# Patient Record
Sex: Female | Born: 1960 | Race: Black or African American | Hispanic: No | Marital: Single | Smoking: Current every day smoker
Health system: Southern US, Community
[De-identification: ages and names within clinical notes are randomized; demographics above are authoritative.]

## PROBLEM LIST (undated history)

## (undated) DIAGNOSIS — D869 Sarcoidosis, unspecified: Secondary | ICD-10-CM

## (undated) DIAGNOSIS — J45909 Unspecified asthma, uncomplicated: Secondary | ICD-10-CM

## (undated) DIAGNOSIS — I10 Essential (primary) hypertension: Secondary | ICD-10-CM

## (undated) DIAGNOSIS — J449 Chronic obstructive pulmonary disease, unspecified: Secondary | ICD-10-CM

## (undated) HISTORY — PX: THROAT SURGERY: SHX803

## (undated) HISTORY — PX: FOOT FUSION: SHX956

## (undated) HISTORY — PX: HERNIA REPAIR: SHX51

## (undated) HISTORY — PX: TONSILLECTOMY: SUR1361

---

## 1997-08-20 ENCOUNTER — Emergency Department (HOSPITAL_COMMUNITY): Admission: EM | Admit: 1997-08-20 | Discharge: 1997-08-20 | Payer: Self-pay | Admitting: Emergency Medicine

## 2013-03-26 ENCOUNTER — Emergency Department (HOSPITAL_BASED_OUTPATIENT_CLINIC_OR_DEPARTMENT_OTHER): Payer: Medicaid Other

## 2013-03-26 ENCOUNTER — Encounter (HOSPITAL_BASED_OUTPATIENT_CLINIC_OR_DEPARTMENT_OTHER): Payer: Self-pay | Admitting: Emergency Medicine

## 2013-03-26 ENCOUNTER — Emergency Department (HOSPITAL_BASED_OUTPATIENT_CLINIC_OR_DEPARTMENT_OTHER)
Admission: EM | Admit: 2013-03-26 | Discharge: 2013-03-27 | Disposition: A | Discharge status: Home / Self Care | Payer: Medicaid Other | Attending: Emergency Medicine | Admitting: Emergency Medicine

## 2013-03-26 DIAGNOSIS — Z880 Allergy status to penicillin: Secondary | ICD-10-CM | POA: Insufficient documentation

## 2013-03-26 DIAGNOSIS — Z79899 Other long term (current) drug therapy: Secondary | ICD-10-CM | POA: Insufficient documentation

## 2013-03-26 DIAGNOSIS — J441 Chronic obstructive pulmonary disease with (acute) exacerbation: Secondary | ICD-10-CM | POA: Insufficient documentation

## 2013-03-26 DIAGNOSIS — I10 Essential (primary) hypertension: Secondary | ICD-10-CM | POA: Insufficient documentation

## 2013-03-26 DIAGNOSIS — M5430 Sciatica, unspecified side: Secondary | ICD-10-CM | POA: Insufficient documentation

## 2013-03-26 DIAGNOSIS — M5441 Lumbago with sciatica, right side: Secondary | ICD-10-CM

## 2013-03-26 DIAGNOSIS — Z8619 Personal history of other infectious and parasitic diseases: Secondary | ICD-10-CM | POA: Insufficient documentation

## 2013-03-26 DIAGNOSIS — F17200 Nicotine dependence, unspecified, uncomplicated: Secondary | ICD-10-CM | POA: Insufficient documentation

## 2013-03-26 DIAGNOSIS — IMO0002 Reserved for concepts with insufficient information to code with codable children: Secondary | ICD-10-CM | POA: Insufficient documentation

## 2013-03-26 HISTORY — DX: Essential (primary) hypertension: I10

## 2013-03-26 HISTORY — DX: Chronic obstructive pulmonary disease, unspecified: J44.9

## 2013-03-26 HISTORY — DX: Sarcoidosis, unspecified: D86.9
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
## 2013-03-26 NOTE — ED Provider Notes (Signed)
CSN: [id number]     Arrival date & time 03/26/13  2327 History   First MD Initiated Contact with Patient 03/26/13 2345     Chief Complaint  Patient presents with  . Back Pain     (Consider location/radiation/quality/duration/timing/severity/associated sxs/prior Treatment) Patient is a 53 y.o. female presenting with back pain. The history is provided by the patient.  Back Pain Location:  Lumbar spine Quality:  Stabbing Radiates to:  R thigh Pain severity:  Severe Pain is:  Same all the time Onset quality:  Gradual Duration:  3 days Timing:  Constant Progression:  Worsening Chronicity:  New Context: not recent injury   Relieved by:  Nothing Worsened by:  Ambulation and movement Ineffective treatments:  Ibuprofen Associated symptoms: leg pain   Associated symptoms: no abdominal pain, no bladder incontinence, no bowel incontinence, no chest pain, no dysuria and no fever    Dawn Moses is a 53 y.o. female who presents to the ED with low back pain that started 3 days ago and has gotten progressively worse. She took ibuprofen without relief. Today the pain is much worsen and radiates down the right leg. She went to [facility] and they told her there were 30 patient's ahead of her and [facility]  Patient recently evaluated by her pulmonologist and given cortisone injection and antibiotic. She will also be starting a Z-Pack tomorrow.   Past Medical History  Diagnosis Date  . COPD (chronic obstructive pulmonary disease)   . Sarcoidosis   . Hypertension    Past Surgical History  Procedure Laterality Date  . Foot fusion    . Throat surgery    . Hernia repair    . Tonsillectomy     No family history on file. History  Substance Use Topics  . Smoking status: Current Every Day Smoker -- 0.50 packs/day    Types: Cigarettes  . Smokeless tobacco: Not on file  . Alcohol Use: No   OB History   Grav Para Term Preterm Abortions TAB SAB Ect Mult Living                 Review of Systems  Constitutional: Negative for fever and chills.  HENT: Negative.   Eyes: Negative for visual disturbance.  Respiratory: Positive for cough. Negative for shortness of breath.   Cardiovascular: Negative for chest pain.  Gastrointestinal: Negative for nausea, vomiting, abdominal pain and bowel incontinence.  Genitourinary: Negative for bladder incontinence and dysuria.  Musculoskeletal: Positive for back pain.  Skin: Negative for rash.  Psychiatric/Behavioral: The patient is not nervous/anxious.       Allergies  Chocolate and Penicillins  Home Medications   Current Outpatient Rx  Name  Route  Sig  Dispense  Refill  . albuterol (PROVENTIL HFA;VENTOLIN HFA) 108 (90 BASE) MCG/ACT inhaler   Inhalation   Inhale into the lungs every 6 (six) hours as needed for wheezing or shortness of breath.         . Fluticasone-Salmeterol (ADVAIR HFA IN)   Inhalation   Inhale into the lungs.         . hydrochlorothiazide (HYDRODIURIL) 12.5 MG tablet   Oral   Take 12.5 mg by mouth daily.         Marland Kitchen LISINOPRIL PO   Oral   Take by mouth.          BP 173/98  Pulse 78  Temp(Src) 98.3 F (36.8 C) (Oral)  Resp 18  Ht 5\' 6"  (1.676 m)  Wt 154 lb (69.854 kg)  BMI 24.87 kg/m2  SpO2 98% Physical Exam  Nursing note and vitals reviewed. Constitutional: She is oriented to person, place, and time. She appears well-developed and well-nourished.  HENT:  Head: Normocephalic and atraumatic.  Eyes: EOM are normal.  Neck: Neck supple.  Cardiovascular: Normal rate.   Pulmonary/Chest: Effort normal. She has wheezes.  Abdominal: Soft. There is no tenderness.  Musculoskeletal:       Lumbar back: She exhibits decreased range of motion, tenderness and spasm. She exhibits no deformity, no laceration and normal pulse.       Back:  Neurological: She is alert and oriented to person, place, and time. She has normal strength and normal reflexes. No cranial nerve deficit or sensory  deficit. Gait normal.  Radial and pedal pulses strong, adequate circulation, good touch sensation.   Skin: Skin is warm and dry.  Psychiatric: She has a normal mood and affect. Her behavior is normal.    ED Course  Procedures  Results for orders placed during the hospital encounter of 03/26/13 (from the past 24 hour(s))  URINALYSIS, ROUTINE W REFLEX MICROSCOPIC     Status: Abnormal   Collection Time    03/27/13 12:17 AM      Result Value Ref Range   Color, Urine YELLOW  YELLOW   APPearance CLEAR  CLEAR   Specific Gravity, Urine 1.016  1.005 - 1.030   pH 6.5  5.0 - 8.0   Glucose, UA NEGATIVE  NEGATIVE mg/dL   Hgb urine dipstick NEGATIVE  NEGATIVE   Bilirubin Urine NEGATIVE  NEGATIVE   Ketones, ur NEGATIVE  NEGATIVE mg/dL   Protein, ur NEGATIVE  NEGATIVE mg/dL   Urobilinogen, UA 1.0  0.0 - 1.0 mg/dL   Nitrite NEGATIVE  NEGATIVE   Leukocytes, UA SMALL (*) NEGATIVE  URINE MICROSCOPIC-ADD ON     Status: Abnormal   Collection Time    03/27/13 12:17 AM      Result Value Ref Range   Squamous Epithelial / LPF FEW (*) RARE   WBC, UA 3-6  <3 WBC/hpf   Bacteria, UA FEW (*) RARE    Dg Lumbar Spine Complete  03/27/2013   CLINICAL DATA:  Low back pain radiating to right leg.  EXAM: LUMBAR SPINE - COMPLETE 4+ VIEW  COMPARISON:  None available for comparison at time of study interpretation.  FINDINGS: Five non rib-bearing lumbar type vertebral bodies are intact and aligned with maintenance of lumbar lordosis. However, there is slight L3 superior endplate focal irregularity. Severe lower lumbar facet arthropathy. No destructive bony lesions. No pars interarticularis defects.  Sacroiliac joints are symmetric. Phleboliths in the pelvis. Prevertebral and paraspinal soft tissue planes are nonsuspicious.  IMPRESSION: Slight L3 superior endplate irregularity could reflect Schmorl's node without fracture deformity or malalignment. Severe lower lumbar facet arthropathy.   Electronically Signed   By: Awilda Metro   On: 03/27/2013 00:44    MDM  53 y.o. female with low back pain that started 3 days ago and has gotten worse with the pain radiating down the right leg. I have reviewed this patient's vital signs, nurses notes, appropriate labs and imaging.  I have discussed findings and plan of care with the patient and she voices understanding. Stable for discharge with normal neuro exam.    Medication List    TAKE these medications       methocarbamol 500 MG tablet  Commonly known as:  ROBAXIN  Take 1 tablet (500 mg total) by  mouth 2 (two) times daily.      ASK your doctor about these medications       ADVAIR HFA IN  Inhale into the lungs.     albuterol 108 (90 BASE) MCG/ACT inhaler  Commonly known as:  PROVENTIL HFA;VENTOLIN HFA  Inhale into the lungs every 6 (six) hours as needed for wheezing or shortness of breath.     hydrochlorothiazide 12.5 MG tablet  Commonly known as:  HYDRODIURIL  Take 12.5 mg by mouth daily.     LISINOPRIL PO  Take by mouth.          [facility] Orlene OchM Rawn Quiroa, NP 03/27/13 [address]  Balin Vandegrift Orlene OchM Vernecia Umble, NP 03/27/13 [phone]

## 2013-03-27 LAB — URINALYSIS, ROUTINE W REFLEX MICROSCOPIC
Bilirubin Urine: NEGATIVE
Glucose, UA: NEGATIVE mg/dL
Hgb urine dipstick: NEGATIVE
KETONES UR: NEGATIVE mg/dL
Nitrite: NEGATIVE
PROTEIN: NEGATIVE mg/dL
Specific Gravity, Urine: 1.016 (ref 1.005–1.030)
Urobilinogen, UA: 1 mg/dL (ref 0.0–1.0)
pH: 6.5 (ref 5.0–8.0)

## 2013-03-27 LAB — URINE MICROSCOPIC-ADD ON

## 2013-03-27 MED ORDER — METHOCARBAMOL 500 MG PO TABS: 500.0000 mg | ORAL_TABLET | Freq: Two times a day (BID) | ORAL | Status: DC

## 2013-03-27 MED ORDER — CYCLOBENZAPRINE HCL 10 MG PO TABS
5.0000 mg | ORAL_TABLET | Freq: Once | ORAL | Status: AC
  Administered 2013-03-27: 5 mg via ORAL
  Filled 2013-03-27: qty 1

## 2013-03-27 MED ORDER — OXYCODONE-ACETAMINOPHEN 5-325 MG PO TABS
1.0000 | ORAL_TABLET | Freq: Once | ORAL | Status: AC
  Administered 2013-03-27: 1 via ORAL
  Filled 2013-03-27: qty 1

## 2013-03-27 NOTE — ED Provider Notes (Signed)
Medical screening examination/treatment/procedure(s) were performed by non-physician practitioner and as supervising physician I was immediately available for consultation/collaboration.   EKG Interpretation None       Lemmie Vanlanen K Rindi Beechy-Rasch, MD 03/27/13 [phone]

## 2013-03-27 NOTE — Discharge Instructions (Signed)
Call your doctor tomorrow to schedule a follow up appointment. Return here as needed. You may need to be scheduled for further testing for the pain that radiates down your leg.

## 2013-03-27 NOTE — ED Notes (Signed)
Pt c/o lower back pain radiating to rt hip and leg onseet Saturday after cleaning,  Denies other inj

## 2013-08-04 ENCOUNTER — Emergency Department (HOSPITAL_BASED_OUTPATIENT_CLINIC_OR_DEPARTMENT_OTHER)
Admission: EM | Admit: 2013-08-04 | Discharge: 2013-08-04 | Disposition: A | Discharge status: Other Acute Inpatient Hospital | Payer: Self-pay | Attending: Emergency Medicine | Admitting: Emergency Medicine

## 2013-08-04 ENCOUNTER — Encounter (HOSPITAL_BASED_OUTPATIENT_CLINIC_OR_DEPARTMENT_OTHER): Payer: Self-pay | Admitting: Emergency Medicine

## 2013-08-04 ENCOUNTER — Emergency Department (HOSPITAL_BASED_OUTPATIENT_CLINIC_OR_DEPARTMENT_OTHER): Payer: Medicaid Other

## 2013-08-04 DIAGNOSIS — Z880 Allergy status to penicillin: Secondary | ICD-10-CM | POA: Insufficient documentation

## 2013-08-04 DIAGNOSIS — IMO0002 Reserved for concepts with insufficient information to code with codable children: Secondary | ICD-10-CM | POA: Insufficient documentation

## 2013-08-04 DIAGNOSIS — J441 Chronic obstructive pulmonary disease with (acute) exacerbation: Secondary | ICD-10-CM | POA: Insufficient documentation

## 2013-08-04 DIAGNOSIS — Z8619 Personal history of other infectious and parasitic diseases: Secondary | ICD-10-CM | POA: Insufficient documentation

## 2013-08-04 DIAGNOSIS — I10 Essential (primary) hypertension: Secondary | ICD-10-CM | POA: Insufficient documentation

## 2013-08-04 DIAGNOSIS — F17200 Nicotine dependence, unspecified, uncomplicated: Secondary | ICD-10-CM | POA: Insufficient documentation

## 2013-08-04 DIAGNOSIS — J45901 Unspecified asthma with (acute) exacerbation: Principal | ICD-10-CM

## 2013-08-04 DIAGNOSIS — Z79899 Other long term (current) drug therapy: Secondary | ICD-10-CM | POA: Insufficient documentation

## 2013-08-04 HISTORY — DX: Unspecified asthma, uncomplicated: J45.909

## 2013-08-04 LAB — I-STAT VENOUS BLOOD GAS, ED
Bicarbonate: 26.2 mEq/L — ABNORMAL HIGH (ref 20.0–24.0)
O2 SAT: 76 %
PO2 VEN: 43 mmHg (ref 30.0–45.0)
Patient temperature: 98.6
TCO2: 28 mmol/L (ref 0–100)
pCO2, Ven: 47 mmHg (ref 45.0–50.0)
pH, Ven: 7.354 — ABNORMAL HIGH (ref 7.250–7.300)

## 2013-08-04 LAB — BASIC METABOLIC PANEL
ANION GAP: 12 (ref 5–15)
BUN: 16 mg/dL (ref 6–23)
CALCIUM: 9.3 mg/dL (ref 8.4–10.5)
CO2: 26 mEq/L (ref 19–32)
CREATININE: 0.8 mg/dL (ref 0.50–1.10)
Chloride: 105 mEq/L (ref 96–112)
GFR calc non Af Amer: 84 mL/min — ABNORMAL LOW (ref >90)
Glucose, Bld: 102 mg/dL — ABNORMAL HIGH (ref 70–99)
Potassium: 4 mEq/L (ref 3.7–5.3)
Sodium: 143 mEq/L (ref 137–147)

## 2013-08-04 LAB — CBC WITH DIFFERENTIAL/PLATELET
BASOS ABS: 0 10*3/uL (ref 0.0–0.1)
BASOS PCT: 0 % (ref 0–1)
EOS PCT: 1 % (ref 0–5)
Eosinophils Absolute: 0.1 10*3/uL (ref 0.0–0.7)
HEMATOCRIT: 38 % (ref 36.0–46.0)
Hemoglobin: 12.9 g/dL (ref 12.0–15.0)
Lymphocytes Relative: 34 % (ref 12–46)
Lymphs Abs: 3.7 10*3/uL (ref 0.7–4.0)
MCH: 31.5 pg (ref 26.0–34.0)
MCHC: 33.9 g/dL (ref 30.0–36.0)
MCV: 92.7 fL (ref 78.0–100.0)
Monocytes Absolute: 0.9 10*3/uL (ref 0.1–1.0)
Monocytes Relative: 8 % (ref 3–12)
Neutro Abs: 6.2 10*3/uL (ref 1.7–7.7)
Neutrophils Relative %: 57 % (ref 43–77)
Platelets: 259 10*3/uL (ref 150–400)
RBC: 4.1 MIL/uL (ref 3.87–5.11)
RDW: 12.1 % (ref 11.5–15.5)
WBC: 11 10*3/uL — ABNORMAL HIGH (ref 4.0–10.5)

## 2013-08-04 LAB — TROPONIN I: Troponin I: <0.3 ng/mL (ref <0.30)

## 2013-08-04 MED ORDER — METHYLPREDNISOLONE SODIUM SUCC 125 MG IJ SOLR
125.0000 mg | Freq: Once | INTRAMUSCULAR | Status: AC
  Administered 2013-08-04: 125 mg via INTRAVENOUS
  Filled 2013-08-04: qty 2

## 2013-08-04 MED ORDER — LEVOFLOXACIN IN D5W 500 MG/100ML IV SOLN
500.0000 mg | Freq: Once | INTRAVENOUS | Status: AC
  Administered 2013-08-04: 500 mg via INTRAVENOUS
  Filled 2013-08-04: qty 100

## 2013-08-04 MED ORDER — ALBUTEROL SULFATE (2.5 MG/3ML) 0.083% IN NEBU: 2.5000 mg | INHALATION_SOLUTION | Freq: Once | RESPIRATORY_TRACT | Status: DC

## 2013-08-04 MED ORDER — MORPHINE SULFATE 4 MG/ML IJ SOLN
4.0000 mg | Freq: Once | INTRAMUSCULAR | Status: AC
  Administered 2013-08-04: 4 mg via INTRAVENOUS
  Filled 2013-08-04: qty 1

## 2013-08-04 MED ORDER — ALBUTEROL SULFATE (2.5 MG/3ML) 0.083% IN NEBU: 5.0000 mg | INHALATION_SOLUTION | RESPIRATORY_TRACT | Status: DC

## 2013-08-04 MED ORDER — ALBUTEROL SULFATE (2.5 MG/3ML) 0.083% IN NEBU
2.5000 mg | INHALATION_SOLUTION | Freq: Once | RESPIRATORY_TRACT | Status: AC
  Administered 2013-08-04: 2.5 mg via RESPIRATORY_TRACT
  Filled 2013-08-04: qty 3

## 2013-08-04 MED ORDER — ALBUTEROL SULFATE (2.5 MG/3ML) 0.083% IN NEBU
10.0000 mg | INHALATION_SOLUTION | Freq: Once | RESPIRATORY_TRACT | Status: AC
  Administered 2013-08-04: 10 mg via RESPIRATORY_TRACT
  Filled 2013-08-04: qty 12

## 2013-08-04 MED ORDER — IPRATROPIUM-ALBUTEROL 0.5-2.5 (3) MG/3ML IN SOLN
3.0000 mL | Freq: Once | RESPIRATORY_TRACT | Status: AC
  Administered 2013-08-04: 3 mL via RESPIRATORY_TRACT
  Filled 2013-08-04: qty 3

## 2013-08-04 NOTE — ED Provider Notes (Signed)
CSN: [id number]     Arrival date & time 08/04/13  0427 History   First MD Initiated Contact with Patient 08/04/13 [phone]     Chief Complaint  Patient presents with  . Respiratory Distress     (Consider location/radiation/quality/duration/timing/severity/associated sxs/prior Treatment) Patient is a 53 y.o. female presenting with shortness of breath. The history is provided by a relative and the patient. The history is limited by the condition of the patient.  Shortness of Breath Severity:  Severe Onset quality:  Gradual Duration:  2 days Timing:  Constant Progression:  Worsening Chronicity:  Recurrent Context: not fumes   Relieved by:  Nothing Worsened by:  Nothing tried Ineffective treatments:  None tried Associated symptoms: cough, sputum production and wheezing   Associated symptoms: no fever   Risk factors: tobacco use   Risk factors: no recent surgery     Past Medical History  Diagnosis Date  . COPD (chronic obstructive pulmonary disease)   . Sarcoidosis   . Hypertension   . Asthma    Past Surgical History  Procedure Laterality Date  . Foot fusion    . Throat surgery    . Hernia repair    . Tonsillectomy     History reviewed. No pertinent family history. History  Substance Use Topics  . Smoking status: Current Every Day Smoker -- 0.50 packs/day    Types: Cigarettes  . Smokeless tobacco: Not on file  . Alcohol Use: No   OB History   Grav Para Term Preterm Abortions TAB SAB Ect Mult Living                 Review of Systems  Constitutional: Negative for fever.  Respiratory: Positive for cough, sputum production, shortness of breath and wheezing.   All other systems reviewed and are negative.     Allergies  Chocolate and Penicillins  Home Medications   Prior to Admission medications   Medication Sig Start Date End Date Taking? Authorizing Provider  albuterol (PROVENTIL HFA;VENTOLIN HFA) 108 (90 BASE) MCG/ACT inhaler Inhale into the lungs every 6  (six) hours as needed for wheezing or shortness of breath.   Yes Historical Provider, MD  fluticasone (FLONASE) 50 MCG/ACT nasal spray Place 1 spray into both nostrils daily.   Yes Historical Provider, MD  Fluticasone-Salmeterol (ADVAIR HFA IN) Inhale into the lungs.   Yes Historical Provider, MD  hydrochlorothiazide (HYDRODIURIL) 12.5 MG tablet Take 12.5 mg by mouth daily.   Yes Historical Provider, MD  LISINOPRIL PO Take by mouth.   Yes Historical Provider, MD  methocarbamol (ROBAXIN) 500 MG tablet Take 1 tablet (500 mg total) by mouth 2 (two) times daily. 03/27/13   Hope Orlene Och, NP   BP 141/93  Temp(Src) 98.1 F (36.7 C) (Oral)  Resp 19  Wt 155 lb (70.308 kg)  SpO2 95% Physical Exam  Constitutional: She is oriented to person, place, and time. She appears well-developed and well-nourished.  HENT:  Head: Normocephalic and atraumatic.  Mouth/Throat: Oropharynx is clear and moist.  Eyes: Conjunctivae are normal. Pupils are equal, round, and reactive to light.  Neck: Normal range of motion. Neck supple. No tracheal deviation present.  Cardiovascular: Normal rate, regular rhythm and intact distal pulses.   Pulmonary/Chest: No stridor. She has decreased breath sounds. She has wheezes. She has no rales.  Abdominal: Soft. Bowel sounds are normal. There is no tenderness. There is no rebound and no guarding.  Musculoskeletal: Normal range of motion. She exhibits no edema.  Neurological: She is  alert and oriented to person, place, and time.  Skin: Skin is warm and dry. She is not diaphoretic.  Psychiatric: She has a normal mood and affect.    ED Course  Procedures (including critical care time) Labs Review Labs Reviewed  CBC WITH DIFFERENTIAL - Abnormal; Notable for the following:    WBC 11.0 (*)    All other components within normal limits  BASIC METABOLIC PANEL    Imaging Review Dg Chest Portable 1 View  08/04/2013   CLINICAL DATA:  Respiratory difficulty.  Productive cough.  EXAM:  PORTABLE CHEST - 1 VIEW  COMPARISON:  Chest radiograph February 27, 2012 and CT of the chest February 27, 2012  FINDINGS: The cardiac silhouette is upper limits of normal in size, mediastinal silhouette is nonsuspicious. Diffuse reticular nodular densities, relatively unchanged without pleural effusions or focal consolidations. Trachea projects midline and there is no pneumothorax. Soft tissue planes and included osseous structures are unchanged, sub cm calcification right breast.  IMPRESSION: Diffuse reticular nodular densities, as seen on prior CT may be infectious or inflammatory (possibly related to patient's known sarcoid), superimposed subtle acute component cannot be excluded.  Borderline cardiomegaly.  Sub cm calcification in right breast for which follow-up mammogram is recommended.   Electronically Signed   By: Awilda Metroourtnay  Bloomer   On: 08/04/2013 05:11     EKG Interpretation None      MDM   Final diagnoses:  None     Date: 08/04/2013  Rate: 72  Rhythm: normal sinus rhythm  QRS Axis: normal  Intervals: normal  ST/T Wave abnormalities: normal  Conduction Disutrbances: none  Narrative Interpretation: unremarkable   Medications  levofloxacin (LEVAQUIN) IVPB 500 mg (500 mg Intravenous New Bag/Given 08/04/13 0556)  ipratropium-albuterol (DUONEB) 0.5-2.5 (3) MG/3ML nebulizer solution 3 mL (3 mLs Nebulization Given 08/04/13 0438)  albuterol (PROVENTIL) (2.5 MG/3ML) 0.083% nebulizer solution 2.5 mg (2.5 mg Nebulization Given 08/04/13 0438)  methylPREDNISolone sodium succinate (SOLU-MEDROL) 125 mg/2 mL injection 125 mg (125 mg Intravenous Given 08/04/13 0500)  albuterol (PROVENTIL) (2.5 MG/3ML) 0.083% nebulizer solution 10 mg (10 mg Nebulization Given 08/04/13 0502)    MDM Reviewed: previous chart, nursing note and vitals Reviewed previous: x-ray [facility] Interpretation: labs, ECG and x-ray (negative troponin slight elevation of WBC ) Total time providing critical care: < 30  minutes. This excludes time spent performing separately reportable procedures and services. Consults: admitting MD   CRITICAL CARE Performed by: Jasmine AwePALUMBO-RASCH,Ronasia Isola K Total critical care time: 31 minutes Critical care time was exclusive of separately billable procedures and treating other patients. Critical care was necessary to treat or prevent imminent or life-threatening deterioration. Critical care was time spent personally by me on the following activities: development of treatment plan with patient and/or surrogate as well as nursing, discussions with consultants, evaluation of patient's response to treatment, examination of patient, obtaining history from patient or surrogate, ordering and performing treatments and interventions, ordering and review of laboratory studies, ordering and review of radiographic studies, pulse oximetry and re-evaluation of patient's condition.     Jasmine AweApril K Taytum Wheller-Rasch, MD 08/04/13 [phone]

## 2013-08-04 NOTE — ED Notes (Signed)
[facility] here at this time [facility]

## 2013-08-04 NOTE — ED Notes (Signed)
resp diff x 2 days with no relief from inhalers, productive cough with yellow phlegm

## 2013-08-04 NOTE — ED Notes (Signed)
[facility]

## 2013-10-13 ENCOUNTER — Emergency Department (HOSPITAL_BASED_OUTPATIENT_CLINIC_OR_DEPARTMENT_OTHER)
Admission: EM | Admit: 2013-10-13 | Discharge: 2013-10-13 | Disposition: A | Discharge status: Home / Self Care | Payer: Medicaid Other | Attending: Emergency Medicine | Admitting: Emergency Medicine

## 2013-10-13 ENCOUNTER — Emergency Department (HOSPITAL_BASED_OUTPATIENT_CLINIC_OR_DEPARTMENT_OTHER): Payer: Medicaid Other

## 2013-10-13 ENCOUNTER — Encounter (HOSPITAL_BASED_OUTPATIENT_CLINIC_OR_DEPARTMENT_OTHER): Payer: Self-pay | Admitting: Emergency Medicine

## 2013-10-13 DIAGNOSIS — Z79899 Other long term (current) drug therapy: Secondary | ICD-10-CM | POA: Insufficient documentation

## 2013-10-13 DIAGNOSIS — K2990 Gastroduodenitis, unspecified, without bleeding: Principal | ICD-10-CM

## 2013-10-13 DIAGNOSIS — I10 Essential (primary) hypertension: Secondary | ICD-10-CM | POA: Insufficient documentation

## 2013-10-13 DIAGNOSIS — J4489 Other specified chronic obstructive pulmonary disease: Secondary | ICD-10-CM | POA: Insufficient documentation

## 2013-10-13 DIAGNOSIS — F17200 Nicotine dependence, unspecified, uncomplicated: Secondary | ICD-10-CM | POA: Insufficient documentation

## 2013-10-13 DIAGNOSIS — J449 Chronic obstructive pulmonary disease, unspecified: Secondary | ICD-10-CM | POA: Insufficient documentation

## 2013-10-13 DIAGNOSIS — J3489 Other specified disorders of nose and nasal sinuses: Secondary | ICD-10-CM | POA: Insufficient documentation

## 2013-10-13 DIAGNOSIS — Z3202 Encounter for pregnancy test, result negative: Secondary | ICD-10-CM | POA: Insufficient documentation

## 2013-10-13 DIAGNOSIS — R1033 Periumbilical pain: Secondary | ICD-10-CM | POA: Insufficient documentation

## 2013-10-13 DIAGNOSIS — K2970 Gastritis, unspecified, without bleeding: Secondary | ICD-10-CM | POA: Insufficient documentation

## 2013-10-13 DIAGNOSIS — R519 Headache, unspecified: Secondary | ICD-10-CM

## 2013-10-13 DIAGNOSIS — R51 Headache: Secondary | ICD-10-CM | POA: Insufficient documentation

## 2013-10-13 DIAGNOSIS — Z880 Allergy status to penicillin: Secondary | ICD-10-CM | POA: Insufficient documentation

## 2013-10-13 DIAGNOSIS — IMO0002 Reserved for concepts with insufficient information to code with codable children: Secondary | ICD-10-CM | POA: Insufficient documentation

## 2013-10-13 DIAGNOSIS — Z8619 Personal history of other infectious and parasitic diseases: Secondary | ICD-10-CM | POA: Insufficient documentation

## 2013-10-13 DIAGNOSIS — R112 Nausea with vomiting, unspecified: Secondary | ICD-10-CM | POA: Insufficient documentation

## 2013-10-13 LAB — URINALYSIS, ROUTINE W REFLEX MICROSCOPIC
BILIRUBIN URINE: NEGATIVE
Glucose, UA: NEGATIVE mg/dL
Hgb urine dipstick: NEGATIVE
KETONES UR: NEGATIVE mg/dL
Leukocytes, UA: NEGATIVE
NITRITE: NEGATIVE
PH: 6 (ref 5.0–8.0)
Protein, ur: NEGATIVE mg/dL
Specific Gravity, Urine: 1.014 (ref 1.005–1.030)
Urobilinogen, UA: 1 mg/dL (ref 0.0–1.0)

## 2013-10-13 LAB — PREGNANCY, URINE: Preg Test, Ur: NEGATIVE

## 2013-10-13 LAB — COMPREHENSIVE METABOLIC PANEL
ALBUMIN: 3.5 g/dL (ref 3.5–5.2)
ALT: 17 U/L (ref 0–35)
ANION GAP: 13 (ref 5–15)
AST: 24 U/L (ref 0–37)
Alkaline Phosphatase: 72 U/L (ref 39–117)
BILIRUBIN TOTAL: 0.6 mg/dL (ref 0.3–1.2)
BUN: 10 mg/dL (ref 6–23)
CHLORIDE: 104 meq/L (ref 96–112)
CO2: 23 meq/L (ref 19–32)
CREATININE: 0.7 mg/dL (ref 0.50–1.10)
Calcium: 9.4 mg/dL (ref 8.4–10.5)
GFR calc Af Amer: >90 mL/min (ref >90)
Glucose, Bld: 84 mg/dL (ref 70–99)
Potassium: 4 mEq/L (ref 3.7–5.3)
Sodium: 140 mEq/L (ref 137–147)
Total Protein: 7.6 g/dL (ref 6.0–8.3)

## 2013-10-13 LAB — LIPASE, BLOOD: LIPASE: 31 U/L (ref 11–59)

## 2013-10-13 MED ORDER — DICYCLOMINE HCL 10 MG/ML IM SOLN
20.0000 mg | Freq: Once | INTRAMUSCULAR | Status: AC
  Administered 2013-10-13: 20 mg via INTRAMUSCULAR
  Filled 2013-10-13: qty 2

## 2013-10-13 MED ORDER — KETOROLAC TROMETHAMINE 30 MG/ML IJ SOLN
30.0000 mg | Freq: Once | INTRAMUSCULAR | Status: AC
  Administered 2013-10-13: 30 mg via INTRAVENOUS
  Filled 2013-10-13: qty 1

## 2013-10-13 MED ORDER — DIPHENHYDRAMINE HCL 50 MG/ML IJ SOLN
12.5000 mg | Freq: Once | INTRAMUSCULAR | Status: AC
  Administered 2013-10-13: 12.5 mg via INTRAVENOUS
  Filled 2013-10-13: qty 1

## 2013-10-13 MED ORDER — DEXAMETHASONE SODIUM PHOSPHATE 4 MG/ML IJ SOLN
4.0000 mg | Freq: Once | INTRAMUSCULAR | Status: AC
  Administered 2013-10-13: 4 mg via INTRAVENOUS
  Filled 2013-10-13: qty 1

## 2013-10-13 MED ORDER — OMEPRAZOLE 20 MG PO CPDR: 20.0000 mg | DELAYED_RELEASE_CAPSULE | Freq: Every day | ORAL | Status: DC

## 2013-10-13 MED ORDER — METOCLOPRAMIDE HCL 5 MG/ML IJ SOLN
10.0000 mg | Freq: Once | INTRAMUSCULAR | Status: AC
  Administered 2013-10-13: 10 mg via INTRAVENOUS
  Filled 2013-10-13: qty 2

## 2013-10-13 MED ORDER — ONDANSETRON HCL 4 MG/2ML IJ SOLN
4.0000 mg | Freq: Once | INTRAMUSCULAR | Status: AC
  Administered 2013-10-13: 4 mg via INTRAVENOUS
  Filled 2013-10-13: qty 2

## 2013-10-13 MED ORDER — ONDANSETRON 8 MG PO TBDP: ORAL_TABLET | ORAL | Status: AC

## 2013-10-13 NOTE — ED Provider Notes (Signed)
CSN: [id number]     Arrival date & time 10/13/13  0154 History   First MD Initiated Contact with Patient 10/13/13 0158     Chief Complaint  Patient presents with  . Abdominal Cramping  . Headache     (Consider location/radiation/quality/duration/timing/severity/associated sxs/prior Treatment) Patient is a 53 y.o. female presenting with cramps and headaches. The history is provided by the patient.  Abdominal Cramping Pain location:  Periumbilical Pain quality: cramping   Pain radiates to:  Does not radiate Pain severity:  Moderate Onset quality:  Gradual Duration:  3 days Timing:  Intermittent Progression:  Unchanged Chronicity:  New Context: sick contacts   Relieved by:  Nothing Worsened by:  Nothing tried Ineffective treatments:  None tried Associated symptoms: nausea and vomiting   Associated symptoms: no cough, no fever, no sore throat and no vaginal bleeding   Vomiting:    Quality:  Stomach contents   Number of occurrences:  4   Severity:  Mild   Duration:  3 days   Timing:  Rare   Progression:  Resolved Risk factors: no recent hospitalization   Headache Pain location:  Frontal Quality:  Dull Radiates to:  Does not radiate Severity currently:  7/10 Severity at highest:  5/10 Onset quality:  Gradual Timing:  Intermittent Progression:  Unchanged Chronicity:  New Associated symptoms: congestion, nausea and vomiting   Associated symptoms: no back pain, no blurred vision, no cough, no dizziness, no drainage, no ear pain, no fever, no focal weakness, no hearing loss, no myalgias, no neck pain, no neck stiffness, no paresthesias, no photophobia, no seizures, no sore throat, no swollen glands and no weakness   Risk factors: no anger   Has not taken BP meds.  Headache was not sudden onset nor was it worst HA of life.  No travel or tick exposure.  No rashes on the skin no neuro symptoms  Past Medical History  Diagnosis Date  . COPD (chronic obstructive pulmonary  disease)   . Sarcoidosis   . Hypertension   . Asthma    Past Surgical History  Procedure Laterality Date  . Foot fusion    . Throat surgery    . Hernia repair    . Tonsillectomy     History reviewed. No pertinent family history. History  Substance Use Topics  . Smoking status: Current Every Day Smoker -- 0.50 packs/day    Types: Cigarettes  . Smokeless tobacco: Not on file  . Alcohol Use: No   OB History   Grav Para Term Preterm Abortions TAB SAB Ect Mult Living                 Review of Systems  Constitutional: Negative for fever.  HENT: Positive for congestion. Negative for ear pain, hearing loss, postnasal drip and sore throat.   Eyes: Negative for blurred vision and photophobia.  Respiratory: Negative for cough.   Gastrointestinal: Positive for nausea and vomiting.  Genitourinary: Negative for vaginal bleeding.  Musculoskeletal: Negative for back pain, myalgias, neck pain and neck stiffness.  Neurological: Positive for headaches. Negative for dizziness, focal weakness, seizures and paresthesias.  All other systems reviewed and are negative.     Allergies  Chocolate and Penicillins  Home Medications   Prior to Admission medications   Medication Sig Start Date End Date Taking? Authorizing Provider  albuterol (PROVENTIL HFA;VENTOLIN HFA) 108 (90 BASE) MCG/ACT inhaler Inhale into the lungs every 6 (six) hours as needed for wheezing or shortness of breath.  Historical Provider, MD  fluticasone (FLONASE) 50 MCG/ACT nasal spray Place 1 spray into both nostrils daily.    Historical Provider, MD  Fluticasone-Salmeterol (ADVAIR HFA IN) Inhale into the lungs.    Historical Provider, MD  hydrochlorothiazide (HYDRODIURIL) 12.5 MG tablet Take 12.5 mg by mouth daily.    Historical Provider, MD  LISINOPRIL PO Take by mouth.    Historical Provider, MD  methocarbamol (ROBAXIN) 500 MG tablet Take 1 tablet (500 mg total) by mouth 2 (two) times daily. 03/27/13   Hope Orlene Och, NP    BP 184/100  Pulse 70  Temp(Src) 98.5 F (36.9 C) (Oral)  Resp 16  Wt 156 lb (70.761 kg)  SpO2 100% Physical Exam  Constitutional: She is oriented to person, place, and time. She appears well-developed and well-nourished. No distress.  HENT:  Head: Normocephalic and atraumatic.  Right Ear: External ear normal.  Left Ear: External ear normal.  Mouth/Throat: Oropharynx is clear and moist.  No temporal tenderness  Eyes: Conjunctivae and EOM are normal. Pupils are equal, round, and reactive to light.  Neck: Normal range of motion. Neck supple.  No meningismus sitting in room comfortably with the lights on  Cardiovascular: Normal rate, regular rhythm and intact distal pulses.   Pulmonary/Chest: Effort normal and breath sounds normal. She has no wheezes. She has no rales.  Abdominal: Soft. Bowel sounds are normal. There is no tenderness. There is no rebound and no guarding.  Musculoskeletal: Normal range of motion.  Lymphadenopathy:    She has no cervical adenopathy.  Neurological: She is alert and oriented to person, place, and time. She has normal reflexes. She displays normal reflexes. No cranial nerve deficit. She exhibits normal muscle tone.  Skin: Skin is warm and dry.  Psychiatric: She has a normal mood and affect.    ED Course  Procedures (including critical care time) Labs Review Labs Reviewed  URINALYSIS, ROUTINE W REFLEX MICROSCOPIC  COMPREHENSIVE METABOLIC PANEL  LIPASE, BLOOD  PREGNANCY, URINE    Imaging Review No results found.   EKG Interpretation None      MDM   Final diagnoses:  None    Symptoms are classically viral in etiology. Headache likely secondary to virus and the fact that patient has not been taking her BP medication.  No meningismus no fever no neck pain nor stiff neck no neurologic symptoms.  No travel no tick exposure is sitting in the room with the lights on comfortably.  Suspect vomiting and cramping persisted as patient was attempting  to eat fried chicken and egg/bacon sandwiches.  Have given rx for prilosec and zofran and bland diet to follow. No indication for LP or imaging of the abdomen at this time.  Exam and vitals benign and reassuring patient is markedly improved post medication   Dilia Alemany K Rafi Kenneth-Rasch, MD 10/13/13 [phone]

## 2013-10-13 NOTE — ED Notes (Signed)
Patient reports a 3 days history of Abdominal cramping with N/V/D. State that the N/V have resolved. Able to Drink but causes frequent loose stools. [place] state that [place] never [place].

## 2016-04-23 IMAGING — CT CT HEAD W/O CM
1 series · 16 of 30 positions shown, 20 images · non-contrast
Comparison: 02/27/2012

CLINICAL DATA: Bifrontal headache for 3 days

EXAM:
CT HEAD WITHOUT CONTRAST
TECHNIQUE: Contiguous axial images were obtained from the base of the skull
through the vertex without intravenous contrast.

[Series 2: head 4.8 h37s · axial · 0.45mm/px · z∈[-158,-21]mm · 16 of 32 slices shown, 20 images]
[im 2/32  brain]
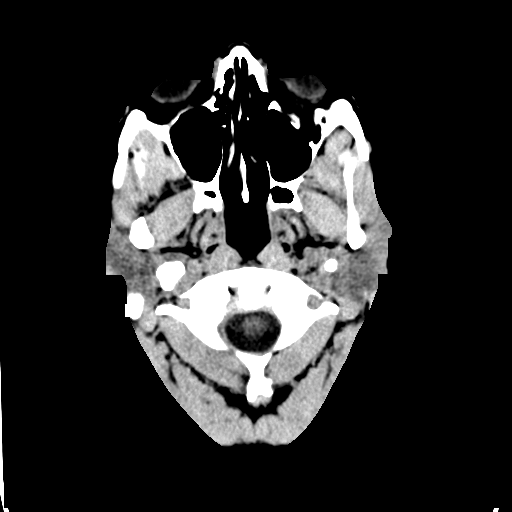
[im 2/32  bone]
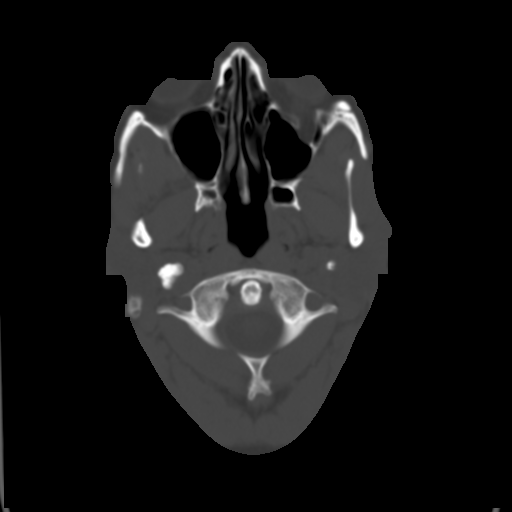
[im 4/32  brain]
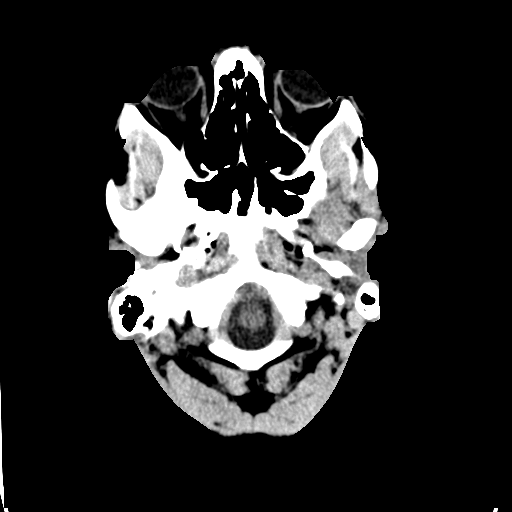
[im 6/32  brain]
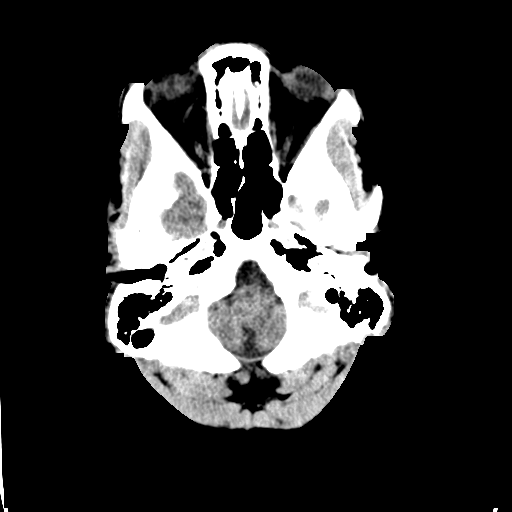
[im 8/32  brain]
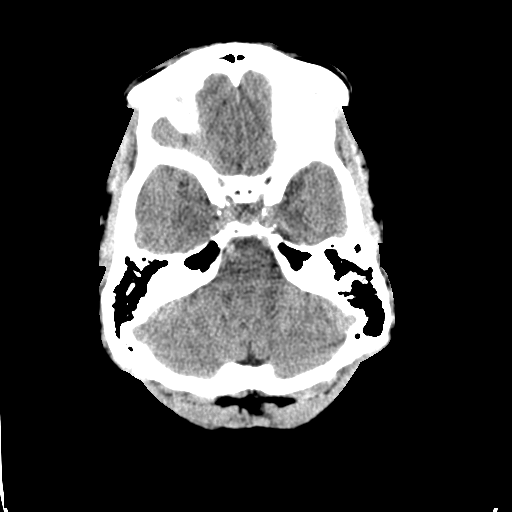
[im 9/32  brain]
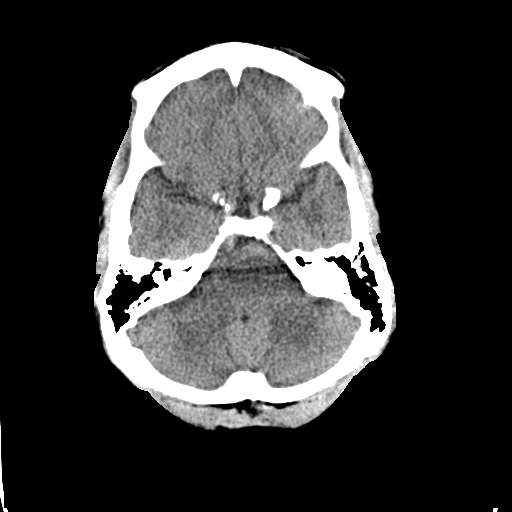
[im 9/32  bone]
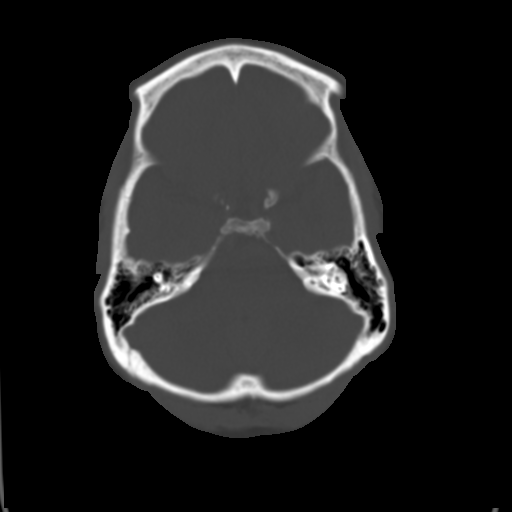
[im 11/32  brain]
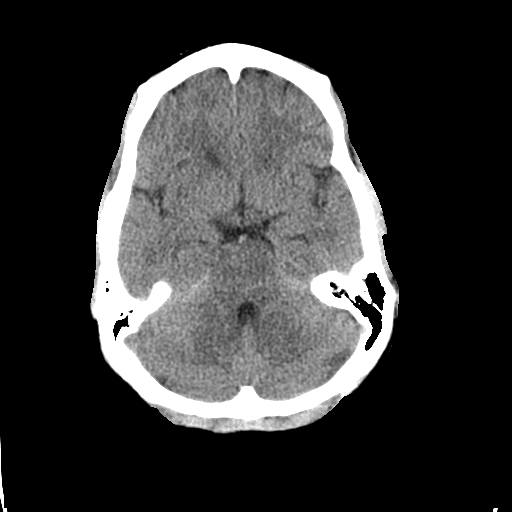
[im 13/32  brain]
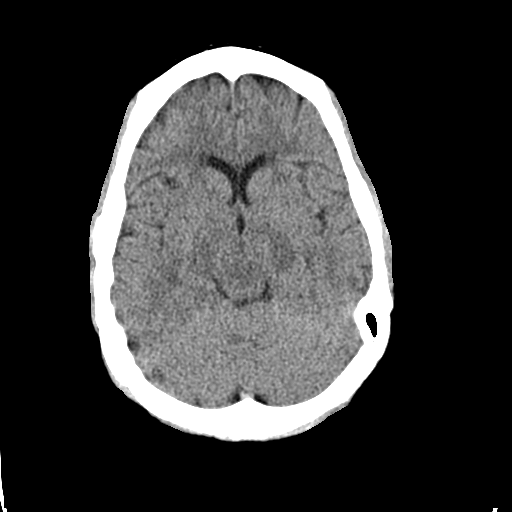
[im 15/32  brain]
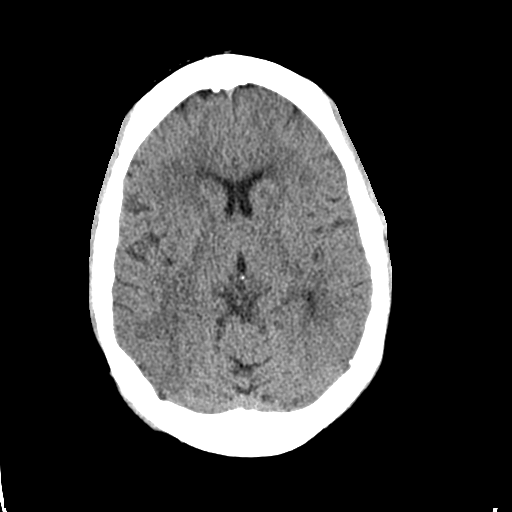
[im 17/32  brain]
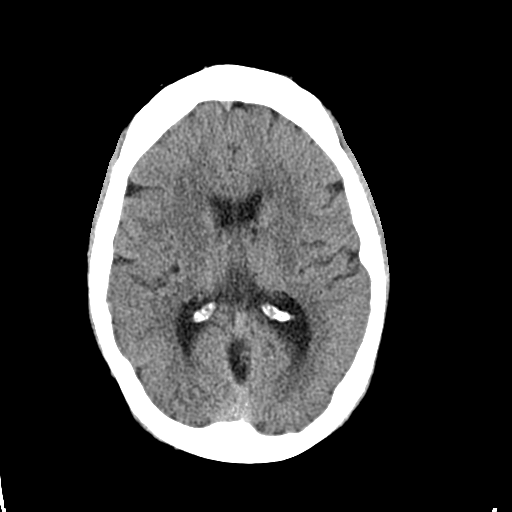
[im 17/32  bone]
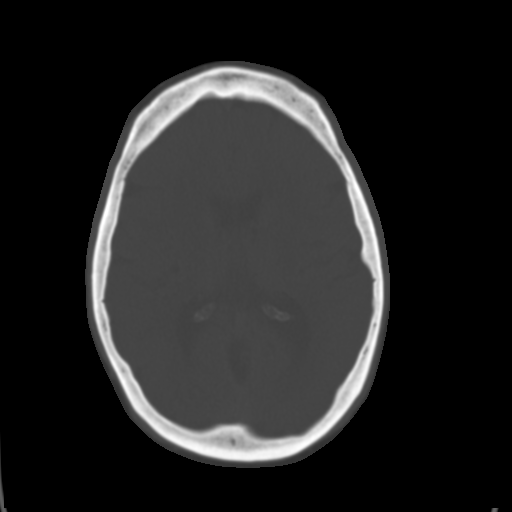
[im 19/32  brain]
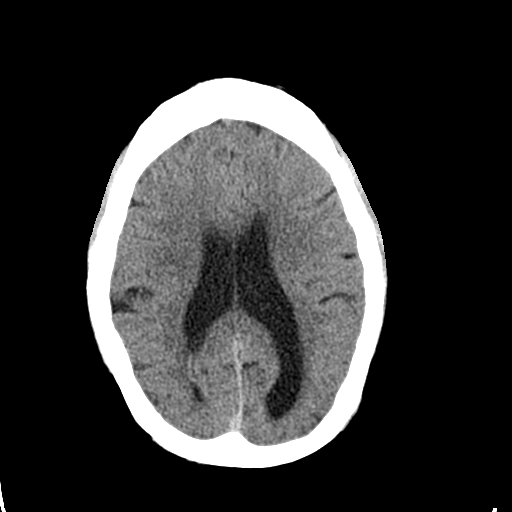
[im 21/32  brain]
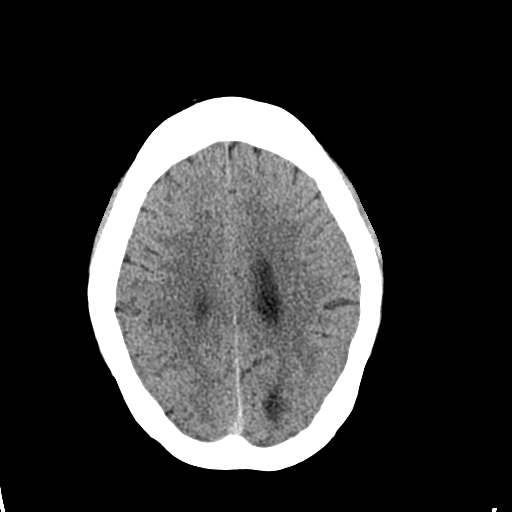
[im 23/32  brain]
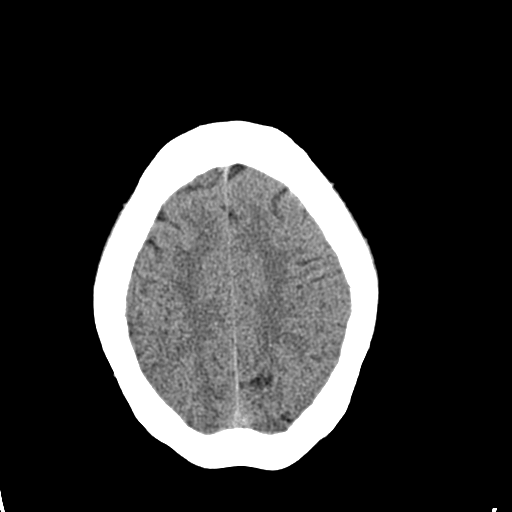
[im 24/32  brain]
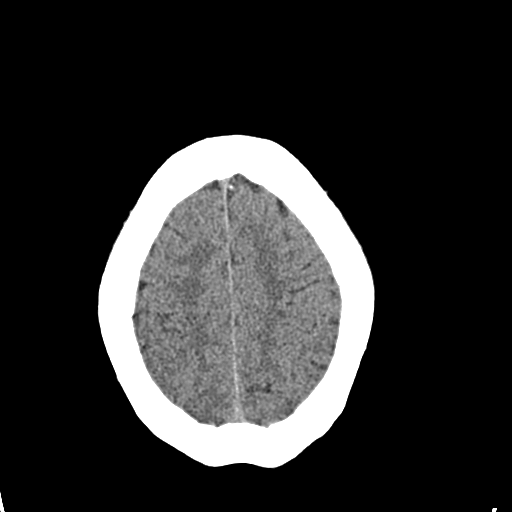
[im 24/32  bone]
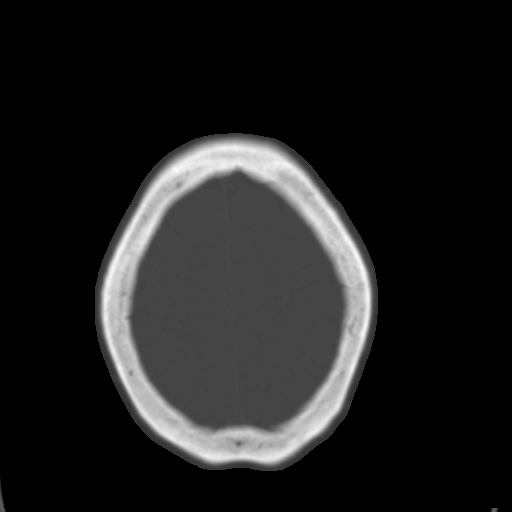
[im 26/32  brain]
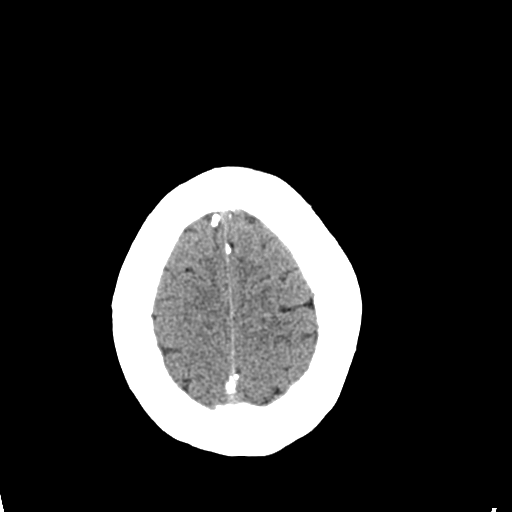
[im 28/32  brain]
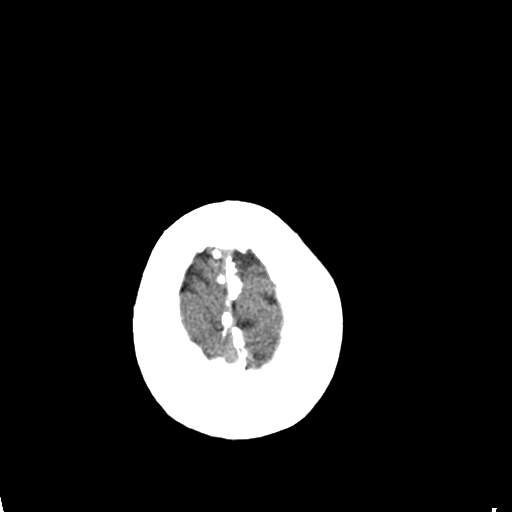
[im 30/32  brain]
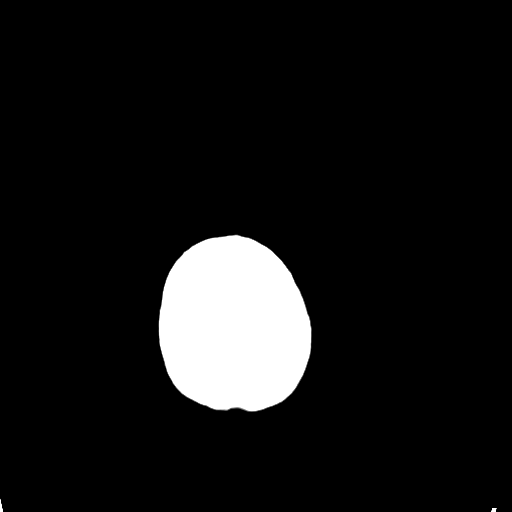

[16 of 30 positions shown; findings below may reference images not displayed]

FINDINGS: There is no evidence of mass effect, midline shift or extra-axial
fluid collections. There is no evidence of a space-occupying lesion
or intracranial hemorrhage. There is no evidence of a cortical-based
area of acute infarction. There is mild periventricular white matter
low attenuation likely secondary to microangiopathy.

The ventricles and sulci are appropriate for the patient's age. The
basal cisterns are patent.

Visualized portions of the orbits are unremarkable. The visualized
portions of the paranasal sinuses and mastoid air cells are
unremarkable.

The osseous structures are unremarkable.
IMPRESSION: No acute intracranial pathology.

## 2020-11-27 ENCOUNTER — Other Ambulatory Visit (HOSPITAL_COMMUNITY): Payer: Self-pay

## 2020-11-27 ENCOUNTER — Emergency Department (HOSPITAL_BASED_OUTPATIENT_CLINIC_OR_DEPARTMENT_OTHER): Payer: Medicaid Other

## 2020-11-27 ENCOUNTER — Other Ambulatory Visit: Payer: Self-pay

## 2020-11-27 ENCOUNTER — Emergency Department (HOSPITAL_BASED_OUTPATIENT_CLINIC_OR_DEPARTMENT_OTHER)
Admission: EM | Admit: 2020-11-27 | Discharge: 2020-11-27 | Disposition: A | Discharge status: Home / Self Care | Payer: Medicaid Other | Attending: Emergency Medicine | Admitting: Emergency Medicine

## 2020-11-27 ENCOUNTER — Encounter (HOSPITAL_BASED_OUTPATIENT_CLINIC_OR_DEPARTMENT_OTHER): Payer: Self-pay | Admitting: Emergency Medicine

## 2020-11-27 ENCOUNTER — Other Ambulatory Visit (HOSPITAL_BASED_OUTPATIENT_CLINIC_OR_DEPARTMENT_OTHER): Payer: Self-pay

## 2020-11-27 DIAGNOSIS — R000 Tachycardia, unspecified: Secondary | ICD-10-CM | POA: Diagnosis not present

## 2020-11-27 DIAGNOSIS — Z7952 Long term (current) use of systemic steroids: Secondary | ICD-10-CM | POA: Diagnosis not present

## 2020-11-27 DIAGNOSIS — R0602 Shortness of breath: Secondary | ICD-10-CM

## 2020-11-27 DIAGNOSIS — R0682 Tachypnea, not elsewhere classified: Secondary | ICD-10-CM | POA: Insufficient documentation

## 2020-11-27 DIAGNOSIS — U071 COVID-19: Secondary | ICD-10-CM | POA: Diagnosis not present

## 2020-11-27 DIAGNOSIS — I10 Essential (primary) hypertension: Secondary | ICD-10-CM | POA: Insufficient documentation

## 2020-11-27 DIAGNOSIS — F17210 Nicotine dependence, cigarettes, uncomplicated: Secondary | ICD-10-CM | POA: Diagnosis not present

## 2020-11-27 DIAGNOSIS — J45909 Unspecified asthma, uncomplicated: Secondary | ICD-10-CM | POA: Diagnosis not present

## 2020-11-27 DIAGNOSIS — J441 Chronic obstructive pulmonary disease with (acute) exacerbation: Secondary | ICD-10-CM | POA: Diagnosis not present

## 2020-11-27 DIAGNOSIS — Z79899 Other long term (current) drug therapy: Secondary | ICD-10-CM | POA: Insufficient documentation

## 2020-11-27 LAB — RESP PANEL BY RT-PCR (FLU A&B, COVID) ARPGX2
Influenza A by PCR: NEGATIVE
Influenza B by PCR: NEGATIVE
SARS Coronavirus 2 by RT PCR: POSITIVE — AB

## 2020-11-27 MED ORDER — DEXAMETHASONE SODIUM PHOSPHATE 10 MG/ML IJ SOLN
10.0000 mg | Freq: Once | INTRAMUSCULAR | Status: AC
  Administered 2020-11-27: 10 mg via INTRAMUSCULAR
  Filled 2020-11-27: qty 1

## 2020-11-27 MED ORDER — ALBUTEROL SULFATE HFA 108 (90 BASE) MCG/ACT IN AERS: 2.0000 | INHALATION_SPRAY | RESPIRATORY_TRACT | Status: DC | PRN

## 2020-11-27 MED ORDER — NIRMATRELVIR/RITONAVIR (PAXLOVID)TABLET
3.0000 | ORAL_TABLET | Freq: Two times a day (BID) | ORAL | 0 refills | Status: AC
Start: 2020-11-27 — End: 2020-12-02
  Filled 2020-11-27: qty 30, 5d supply, fill #0

## 2020-11-27 MED ORDER — NIRMATRELVIR/RITONAVIR (PAXLOVID) TABLET (RENAL DOSING)
2.0000 | ORAL_TABLET | Freq: Two times a day (BID) | ORAL | 0 refills | Status: AC
Start: 2020-11-27 — End: 2020-12-02
  Filled 2020-11-27: qty 20, 5d supply, fill #0

## 2020-11-27 MED ORDER — HYDROCOD POLST-CPM POLST ER 10-8 MG/5ML PO SUER
5.0000 mL | Freq: Once | ORAL | Status: AC
  Administered 2020-11-27: 5 mL via ORAL
  Filled 2020-11-27: qty 5

## 2020-11-27 MED ORDER — IBUPROFEN 400 MG PO TABS
600.0000 mg | ORAL_TABLET | Freq: Once | ORAL | Status: AC
  Administered 2020-11-27: 600 mg via ORAL
  Filled 2020-11-27: qty 1

## 2020-11-27 MED ORDER — IPRATROPIUM-ALBUTEROL 0.5-2.5 (3) MG/3ML IN SOLN
RESPIRATORY_TRACT | Status: AC
  Filled 2020-11-27: qty 3

## 2020-11-27 MED ORDER — ALBUTEROL SULFATE (2.5 MG/3ML) 0.083% IN NEBU
2.5000 mg | INHALATION_SOLUTION | Freq: Once | RESPIRATORY_TRACT | Status: AC
  Administered 2020-11-27: 2.5 mg via RESPIRATORY_TRACT
  Filled 2020-11-27: qty 3

## 2020-11-27 MED ORDER — HYDROCOD POLST-CPM POLST ER 10-8 MG/5ML PO SUER
5.0000 mL | Freq: Two times a day (BID) | ORAL | 0 refills | Status: DC | PRN
  Filled 2020-11-27: qty 140, 14d supply, fill #0

## 2020-11-27 MED ORDER — ALBUTEROL SULFATE (2.5 MG/3ML) 0.083% IN NEBU
2.5000 mg | INHALATION_SOLUTION | Freq: Four times a day (QID) | RESPIRATORY_TRACT | 0 refills | Status: AC | PRN
  Filled 2020-11-27 (×2): qty 75, 7d supply, fill #0

## 2020-11-27 MED ORDER — IPRATROPIUM-ALBUTEROL 0.5-2.5 (3) MG/3ML IN SOLN
3.0000 mL | Freq: Once | RESPIRATORY_TRACT | Status: AC
  Administered 2020-11-27: 3 mL via RESPIRATORY_TRACT

## 2020-11-27 MED ORDER — PREDNISONE 10 MG PO TABS
ORAL_TABLET | Freq: Every day | ORAL | 0 refills | Status: DC
  Filled 2020-11-27: qty 42, 12d supply, fill #0

## 2020-11-27 NOTE — ED Provider Notes (Signed)
[facility] EMERGENCY DEPARTMENT Provider Note   CSN: [id number] Arrival date & time: 11/27/20  1207     History Chief Complaint  Patient presents with   Shortness of Breath    Cough x 3 days     Dawn Moses is a 60 y.o. female.  Pt presents to the ED today with sob, cough, chills, body aches for 3 days.  Pt said she has COPD and Sarcoidosis and gets pna a lot.  She has been taking nebs at home without improvement in sx.  Pt has had her initial Covid vaccine, but not the boosters.  No flu shot.      Past Medical History:  Diagnosis Date   Asthma    COPD (chronic obstructive pulmonary disease) (HCC)    Hypertension    Sarcoidosis     There are no problems to display for this patient.   Past Surgical History:  Procedure Laterality Date   FOOT FUSION     HERNIA REPAIR     THROAT SURGERY     TONSILLECTOMY       OB History   No obstetric history on file.     No family history on file.  Social History   Tobacco Use   Smoking status: Every Day    Packs/day: 0.50    Types: Cigarettes  Substance Use Topics   Alcohol use: No   Drug use: No    Home Medications Prior to Admission medications   Medication Sig Start Date End Date Taking? Authorizing Provider  albuterol (PROVENTIL) (2.5 MG/3ML) 0.083% nebulizer solution Take 3 mLs (2.5 mg total) by nebulization every 6 (six) hours as needed for wheezing or shortness of breath. 11/27/20  Yes Jacalyn Lefevre, MD  chlorpheniramine-HYDROcodone [facility] PENNKINETIC ER) 10-8 MG/5ML SUER Take 5 mLs by mouth every 12 (twelve) hours as needed for cough. 11/27/20  Yes Jacalyn Lefevre, MD  nirmatrelvir/ritonavir EUA (PAXLOVID) 20 x 150 MG & 10 x 100MG  TABS Take 3 tablets by mouth 2 (two) times daily for 5 days. Patient GFR is 57. Take nirmatrelvir (150 mg) two tablets twice daily for 5 days and ritonavir (100 mg) one tablet twice daily for 5 days. 11/27/20 12/02/20 Yes 12/04/20, MD  predniSONE  (DELTASONE) 10 MG tablet Take by mouth daily. Take 6 tabs by mouth daily  for 2 days, then 5 tabs for 2 days, then 4 tabs for 2 days, then 3 tabs for 2 days, 2 tabs for 2 days, then 1 tab by mouth daily for 2 days 11/27/20  Yes 13/10/22, MD  albuterol (PROVENTIL HFA;VENTOLIN HFA) 108 (90 BASE) MCG/ACT inhaler Inhale into the lungs every 6 (six) hours as needed for wheezing or shortness of breath.    [provider]  fluticasone (FLONASE) 50 MCG/ACT nasal spray Place 1 spray into both nostrils daily.    [provider]  Fluticasone-Salmeterol (ADVAIR HFA IN) Inhale into the lungs.    [provider]  hydrochlorothiazide (HYDRODIURIL) 12.5 MG tablet Take 12.5 mg by mouth daily.    [provider]  LISINOPRIL PO Take by mouth.    [provider]  methocarbamol (ROBAXIN) 500 MG tablet Take 1 tablet (500 mg total) by mouth 2 (two) times daily. 03/27/13   05/27/13, NP  omeprazole (PRILOSEC) 20 MG capsule Take 1 capsule (20 mg total) by mouth daily. 10/13/13   Palumbo, April, MD  ondansetron (ZOFRAN ODT) 8 MG disintegrating tablet 8mg  ODT q8 hours prn nausea 10/13/13  Palumbo, April, MD    Allergies    Chocolate and Penicillins  Review of Systems   Review of Systems  Constitutional:  Positive for fever.  Respiratory:  Positive for cough, shortness of breath and wheezing.   Musculoskeletal:  Positive for myalgias.  All other systems reviewed and are negative.  Physical Exam Updated Vital Signs BP 129/83 (BP Location: Right Arm)   Pulse (!) 111   Temp 98.4 F (36.9 C) (Oral)   Resp 16   Ht 5\' 6"  (1.676 m)   Wt 59.9 kg   SpO2 100%   BMI 21.31 kg/m   Physical Exam Vitals and nursing note reviewed.  Constitutional:      Appearance: She is well-developed.  HENT:     Head: Normocephalic and atraumatic.     Mouth/Throat:     Mouth: Mucous membranes are moist.     Pharynx: Oropharynx is clear.  Eyes:     Extraocular Movements:  Extraocular movements intact.     Pupils: Pupils are equal, round, and reactive to light.  Cardiovascular:     Rate and Rhythm: Regular rhythm. Tachycardia present.  Pulmonary:     Effort: Tachypnea present.     Breath sounds: Wheezing present.  Abdominal:     Palpations: Abdomen is soft.  Musculoskeletal:        General: Normal range of motion.     Cervical back: Normal range of motion and neck supple.  Skin:    General: Skin is warm.     Capillary Refill: Capillary refill takes less than 2 seconds.  Neurological:     General: No focal deficit present.     Mental Status: She is alert and oriented to person, place, and time.  Psychiatric:        Mood and Affect: Mood normal.        Behavior: Behavior normal.    ED Results / Procedures / Treatments   Labs (all labs ordered are listed, but only abnormal results are displayed) Labs Reviewed  RESP PANEL BY RT-PCR (FLU A&B, COVID) ARPGX2 - Abnormal; Notable for the following components:      Result Value   SARS Coronavirus 2 by RT PCR POSITIVE (*)    All other components within normal limits    EKG EKG Interpretation  Date/Time:  Thursday November 27 2020 12:42:24 EST Ventricular Rate:  107 PR Interval:  128 QRS Duration: 82 QT Interval:  328 QTC Calculation: 437 R Axis:   94 Text Interpretation: Sinus tachycardia with Premature supraventricular complexes Rightward axis Cannot rule out Anterior infarct , age undetermined Abnormal ECG Since last tracing rate faster Confirmed by 05-06-1989 [phone]) on 11/27/2020 12:50:00 PM  Radiology DG Chest Port 1 View  Result Date: 11/27/2020 CLINICAL DATA:  Cough, shortness of breath. History of cervical adenosis EXAM: PORTABLE CHEST 1 VIEW COMPARISON:  03/19/2020 FINDINGS: Heart size is normal. Hyperinflated lungs with chronically coarsened interstitial markings and areas of pleuroparenchymal scarring, most pronounced within the upper lung fields. No evidence of a superimposed  airspace opacity. No pleural effusion or pneumothorax. IMPRESSION: Chronic changes of COPD and sarcoidosis. No evidence of superimposed airspace opacity. Electronically Signed   By: 05/19/2020 D.O.   On: 11/27/2020 13:27    Procedures Procedures   Medications Ordered in ED Medications  albuterol (VENTOLIN HFA) 108 (90 Base) MCG/ACT inhaler 2 puff (has no administration in time range)  ipratropium-albuterol (DUONEB) 0.5-2.5 (3) MG/3ML nebulizer solution 3 mL (3 mLs Nebulization Given 11/27/20 1249)  chlorpheniramine-HYDROcodone (TUSSIONEX) 10-8 MG/5ML suspension 5 mL (5 mLs Oral Given 11/27/20 1333)  ibuprofen (ADVIL) tablet 600 mg (600 mg Oral Given 11/27/20 1334)  albuterol (PROVENTIL) (2.5 MG/3ML) 0.083% nebulizer solution 2.5 mg (2.5 mg Nebulization Given 11/27/20 1334)  dexamethasone (DECADRON) injection 10 mg (10 mg Intramuscular Given 11/27/20 1334)    ED Course  I have reviewed the triage vital signs and the nursing notes.  Pertinent labs & imaging results that were available during my care of the patient were reviewed by me and considered in my medical decision making (see chart for details).    MDM Rules/Calculators/A&P                           Pt is + for Covid.  She is feeling better after nebs/steroids.  Pt meets criteria for Paxlovid.  She had labs last month which showed a GFR of 57.  I don't think I need to repeat labs today.  Pt is stable for d/c.  She is to return if worse.  F/u with pcp.  Dawn Moses was evaluated in Emergency Department on 11/27/2020 for the symptoms described in the history of present illness. She was evaluated in the context of the global COVID-19 pandemic, which necessitated consideration that the patient might be at risk for infection with the SARS-CoV-2 virus that causes COVID-19. Institutional protocols and algorithms that pertain to the evaluation of patients at risk for COVID-19 are in a state of rapid change based on information  released by regulatory bodies including the CDC and federal and state organizations. These policies and algorithms were followed during the patient's care in the ED.  Final Clinical Impression(s) / ED Diagnoses Final diagnoses:  COVID-19  COPD exacerbation (HCC)    Rx / DC Orders ED Discharge Orders          Ordered    nirmatrelvir/ritonavir EUA (PAXLOVID) 20 x 150 MG & 10 x 100MG  TABS  2 times daily        11/27/20 1351    predniSONE (DELTASONE) 10 MG tablet  Daily        11/27/20 1351    albuterol (PROVENTIL) (2.5 MG/3ML) 0.083% nebulizer solution  Every 6 hours PRN        11/27/20 1351    chlorpheniramine-HYDROcodone (TUSSIONEX PENNKINETIC ER) 10-8 MG/5ML SUER  Every 12 hours PRN        11/27/20 1415             13/10/22, MD 11/27/20 1416

## 2020-11-27 NOTE — Discharge Instructions (Signed)
Person Under Monitoring Name: Dawn Moses  Location: [address] [place] [address]   Infection Prevention Recommendations for Individuals Confirmed to have, or Being Evaluated for, 2019 Novel Coronavirus (COVID-19) Infection Who Receive Care at Home  Individuals who are confirmed to have, or are being evaluated for, COVID-19 should follow the prevention steps below until a healthcare provider or local or state health department says they can return to normal activities.  Stay home except to get medical care You should restrict activities outside your home, except for getting medical care. Do not go to work, school, or public areas, and do not use public transportation or taxis.  Call ahead before visiting your doctor Before your medical appointment, call the healthcare provider and tell them that you have, or are being evaluated for, COVID-19 infection. This will help the healthcare provider's office take steps to keep other people from getting infected. Ask your healthcare provider to call the local or state health department.  Monitor your symptoms Seek prompt medical attention if your illness is worsening (e.g., difficulty breathing). Before going to your medical appointment, call the healthcare provider and tell them that you have, or are being evaluated for, COVID-19 infection. Ask your healthcare provider to call the local or state health department.  Wear a facemask You should wear a facemask that covers your nose and mouth when you are in the same room with other people and when you visit a healthcare provider. People who live with or visit you should also wear a facemask while they are in the same room with you.  Separate yourself from other people in your home As much as possible, you should stay in a different room from other people in your home. Also, you should use a separate bathroom, if available.  Avoid sharing household items You should not  share dishes, drinking glasses, cups, eating utensils, towels, bedding, or other items with other people in your home. After using these items, you should wash them thoroughly with soap and water.  Cover your coughs and sneezes Cover your mouth and nose with a tissue when you cough or sneeze, or you can cough or sneeze into your sleeve. Throw used tissues in a lined trash can, and immediately wash your hands with soap and water for at least 20 seconds or use an alcohol-based hand rub.  Wash your [employer] your hands often and thoroughly with soap and water for at least 20 seconds. You can use an alcohol-based hand sanitizer if soap and water are not available and if your hands are not visibly dirty. Avoid touching your eyes, nose, and mouth with unwashed hands.   Prevention Steps for Caregivers and Household Members of Individuals Confirmed to have, or Being Evaluated for, COVID-19 Infection Being Cared for in the Home  If you live with, or provide care at home for, a person confirmed to have, or being evaluated for, COVID-19 infection please follow these guidelines to prevent infection:  Follow healthcare provider's instructions Make sure that you understand and can help the patient follow any healthcare provider instructions for all care.  Provide for the patient's basic needs You should help the patient with basic needs in the home and provide support for getting groceries, prescriptions, and other personal needs.  Monitor the patient's symptoms If they are getting sicker, call his or her medical provider and tell them that the patient has, or is being evaluated for, COVID-19 infection. This will help the healthcare  provider's office take steps to keep other people from getting infected. Ask the healthcare provider to call the local or state health department.  Limit the number of people who have contact with the patient If possible, have only one caregiver for the  patient. Other household members should stay in another home or place of residence. If this is not possible, they should stay in another room, or be separated from the patient as much as possible. Use a separate bathroom, if available. Restrict visitors who do not have an essential need to be in the home.  Keep older adults, very young children, and other sick people away from the patient Keep older adults, very young children, and those who have compromised immune systems or chronic health conditions away from the patient. This includes people with chronic heart, lung, or kidney conditions, diabetes, and cancer.  Ensure good ventilation Make sure that shared spaces in the home have good air flow, such as from an air conditioner or an opened window, weather permitting.  Wash your hands often Wash your hands often and thoroughly with soap and water for at least 20 seconds. You can use an alcohol based hand sanitizer if soap and water are not available and if your hands are not visibly dirty. Avoid touching your eyes, nose, and mouth with unwashed hands. Use disposable paper towels to dry your hands. If not available, use dedicated cloth towels and replace them when they become wet.  Wear a facemask and gloves Wear a disposable facemask at all times in the room and gloves when you touch or have contact with the patient's blood, body fluids, and/or secretions or excretions, such as sweat, saliva, sputum, nasal mucus, vomit, urine, or feces.  Ensure the mask fits over your nose and mouth tightly, and do not touch it during use. Throw out disposable facemasks and gloves after using them. Do not reuse. Wash your hands immediately after removing your facemask and gloves. If your personal clothing becomes contaminated, carefully remove clothing and launder. Wash your hands after handling contaminated clothing. Place all used disposable facemasks, gloves, and other waste in a lined container before  disposing them with other household waste. Remove gloves and wash your hands immediately after handling these items.  Do not share dishes, glasses, or other household items with the patient Avoid sharing household items. You should not share dishes, drinking glasses, cups, eating utensils, towels, bedding, or other items with a patient who is confirmed to have, or being evaluated for, COVID-19 infection. After the person uses these items, you should wash them thoroughly with soap and water.  Wash laundry thoroughly Immediately remove and wash clothes or bedding that have blood, body fluids, and/or secretions or excretions, such as sweat, saliva, sputum, nasal mucus, vomit, urine, or feces, on them. Wear gloves when handling laundry from the patient. Read and follow directions on labels of laundry or clothing items and detergent. In general, wash and dry with the warmest temperatures recommended on the label.  Clean all areas the individual has used often Clean all touchable surfaces, such as counters, tabletops, doorknobs, bathroom fixtures, toilets, phones, keyboards, tablets, and bedside tables, every day. Also, clean any surfaces that may have blood, body fluids, and/or secretions or excretions on them. Wear gloves when cleaning surfaces the patient has come in contact with. Use a diluted bleach solution (e.g., dilute bleach with 1 part bleach and 10 parts water) or a household disinfectant with a label that says EPA-registered for coronaviruses. To make  a bleach solution at home, add 1 tablespoon of bleach to 1 quart (4 cups) of water. For a larger supply, add  cup of bleach to 1 gallon (16 cups) of water. Read labels of cleaning products and follow recommendations provided on product labels. Labels contain instructions for safe and effective use of the cleaning product including precautions you should take when applying the product, such as wearing gloves or eye protection and making sure you  have good ventilation during use of the product. Remove gloves and wash hands immediately after cleaning.  Monitor yourself for signs and symptoms of illness Caregivers and household members are considered close contacts, should monitor their health, and will be asked to limit movement outside of the home to the extent possible. Follow the monitoring steps for close contacts listed on the symptom monitoring form.   ? If you have additional questions, contact your local health department or call the epidemiologist on call at [phone] (available 24/7). ? This guidance is subject to change. For the most up-to-date guidance from [facility], please refer to their website: YouBlogs.pl

## 2020-11-27 NOTE — ED Notes (Signed)
ED Provider at bedside. 

## 2020-11-27 NOTE — ED Triage Notes (Signed)
Cough x 3 days , chills , shortness of breath . Hx COPD . Body aches .

## 2020-11-28 ENCOUNTER — Other Ambulatory Visit (HOSPITAL_BASED_OUTPATIENT_CLINIC_OR_DEPARTMENT_OTHER): Payer: Self-pay

## 2021-04-14 ENCOUNTER — Inpatient Hospital Stay (HOSPITAL_BASED_OUTPATIENT_CLINIC_OR_DEPARTMENT_OTHER)
Admission: EM | Admit: 2021-04-14 | Discharge: 2021-04-19 | DRG: 190 | Disposition: A | Discharge status: Home / Self Care | Payer: Medicaid Other | Attending: Family Medicine | Admitting: Family Medicine

## 2021-04-14 ENCOUNTER — Emergency Department (HOSPITAL_BASED_OUTPATIENT_CLINIC_OR_DEPARTMENT_OTHER): Payer: Medicaid Other

## 2021-04-14 ENCOUNTER — Other Ambulatory Visit: Payer: Self-pay

## 2021-04-14 ENCOUNTER — Encounter (HOSPITAL_BASED_OUTPATIENT_CLINIC_OR_DEPARTMENT_OTHER): Payer: Self-pay | Admitting: Emergency Medicine

## 2021-04-14 DIAGNOSIS — J9601 Acute respiratory failure with hypoxia: Secondary | ICD-10-CM | POA: Diagnosis present

## 2021-04-14 DIAGNOSIS — G43909 Migraine, unspecified, not intractable, without status migrainosus: Secondary | ICD-10-CM | POA: Diagnosis present

## 2021-04-14 DIAGNOSIS — T502X5A Adverse effect of carbonic-anhydrase inhibitors, benzothiadiazides and other diuretics, initial encounter: Secondary | ICD-10-CM | POA: Diagnosis present

## 2021-04-14 DIAGNOSIS — K861 Other chronic pancreatitis: Secondary | ICD-10-CM | POA: Diagnosis present

## 2021-04-14 DIAGNOSIS — K5900 Constipation, unspecified: Secondary | ICD-10-CM | POA: Diagnosis present

## 2021-04-14 DIAGNOSIS — M797 Fibromyalgia: Secondary | ICD-10-CM | POA: Diagnosis present

## 2021-04-14 DIAGNOSIS — I10 Essential (primary) hypertension: Secondary | ICD-10-CM | POA: Diagnosis present

## 2021-04-14 DIAGNOSIS — E876 Hypokalemia: Secondary | ICD-10-CM | POA: Diagnosis present

## 2021-04-14 DIAGNOSIS — Z881 Allergy status to other antibiotic agents status: Secondary | ICD-10-CM

## 2021-04-14 DIAGNOSIS — J4541 Moderate persistent asthma with (acute) exacerbation: Secondary | ICD-10-CM

## 2021-04-14 DIAGNOSIS — D860 Sarcoidosis of lung: Secondary | ICD-10-CM | POA: Diagnosis present

## 2021-04-14 DIAGNOSIS — Z20822 Contact with and (suspected) exposure to covid-19: Secondary | ICD-10-CM | POA: Diagnosis present

## 2021-04-14 DIAGNOSIS — Z91018 Allergy to other foods: Secondary | ICD-10-CM

## 2021-04-14 DIAGNOSIS — Z888 Allergy status to other drugs, medicaments and biological substances status: Secondary | ICD-10-CM

## 2021-04-14 DIAGNOSIS — J441 Chronic obstructive pulmonary disease with (acute) exacerbation: Principal | ICD-10-CM | POA: Diagnosis present

## 2021-04-14 DIAGNOSIS — Z880 Allergy status to penicillin: Secondary | ICD-10-CM

## 2021-04-14 DIAGNOSIS — Z79899 Other long term (current) drug therapy: Secondary | ICD-10-CM

## 2021-04-14 DIAGNOSIS — N179 Acute kidney failure, unspecified: Secondary | ICD-10-CM | POA: Diagnosis present

## 2021-04-14 DIAGNOSIS — E875 Hyperkalemia: Secondary | ICD-10-CM | POA: Diagnosis present

## 2021-04-14 DIAGNOSIS — M609 Myositis, unspecified: Secondary | ICD-10-CM | POA: Diagnosis present

## 2021-04-14 DIAGNOSIS — F17210 Nicotine dependence, cigarettes, uncomplicated: Secondary | ICD-10-CM | POA: Diagnosis present

## 2021-04-14 LAB — CBC
HCT: 37 % (ref 36.0–46.0)
Hemoglobin: 12.7 g/dL (ref 12.0–15.0)
MCH: 33.2 pg (ref 26.0–34.0)
MCHC: 34.3 g/dL (ref 30.0–36.0)
MCV: 96.9 fL (ref 80.0–100.0)
Platelets: 262 10*3/uL (ref 150–400)
RBC: 3.82 MIL/uL — ABNORMAL LOW (ref 3.87–5.11)
RDW: 12.6 % (ref 11.5–15.5)
WBC: 11.4 10*3/uL — ABNORMAL HIGH (ref 4.0–10.5)
nRBC: 0 % (ref 0.0–0.2)

## 2021-04-14 LAB — BASIC METABOLIC PANEL
Anion gap: 9 (ref 5–15)
BUN: 11 mg/dL (ref 6–20)
CO2: 27 mmol/L (ref 22–32)
Calcium: 9.3 mg/dL (ref 8.9–10.3)
Chloride: 103 mmol/L (ref 98–111)
Creatinine, Ser: 0.91 mg/dL (ref 0.44–1.00)
GFR, Estimated: >60 mL/min (ref >60)
Glucose, Bld: 139 mg/dL — ABNORMAL HIGH (ref 70–99)
Potassium: 3.1 mmol/L — ABNORMAL LOW (ref 3.5–5.1)
Sodium: 139 mmol/L (ref 135–145)

## 2021-04-14 LAB — RESP PANEL BY RT-PCR (FLU A&B, COVID) ARPGX2
Influenza A by PCR: NEGATIVE
Influenza B by PCR: NEGATIVE
SARS Coronavirus 2 by RT PCR: NEGATIVE

## 2021-04-14 LAB — TROPONIN I (HIGH SENSITIVITY)
Troponin I (High Sensitivity): 8 ng/L (ref <18)
Troponin I (High Sensitivity): 7 ng/L (ref <18)

## 2021-04-14 MED ORDER — MAGNESIUM SULFATE IN D5W 1-5 GM/100ML-% IV SOLN
1.0000 g | Freq: Once | INTRAVENOUS | Status: AC
  Administered 2021-04-14: 1 g via INTRAVENOUS
  Filled 2021-04-14: qty 100

## 2021-04-14 MED ORDER — ALBUTEROL (5 MG/ML) CONTINUOUS INHALATION SOLN: 10.0000 mg/h | INHALATION_SOLUTION | RESPIRATORY_TRACT | Status: AC

## 2021-04-14 MED ORDER — ALBUTEROL SULFATE (2.5 MG/3ML) 0.083% IN NEBU: 20.0000 mg/h | INHALATION_SOLUTION | RESPIRATORY_TRACT | Status: DC

## 2021-04-14 MED ORDER — IPRATROPIUM-ALBUTEROL 0.5-2.5 (3) MG/3ML IN SOLN
3.0000 mL | Freq: Once | RESPIRATORY_TRACT | Status: AC
  Administered 2021-04-14: 3 mL via RESPIRATORY_TRACT
  Filled 2021-04-14: qty 3

## 2021-04-14 MED ORDER — ACETAMINOPHEN 500 MG PO TABS
1000.0000 mg | ORAL_TABLET | Freq: Once | ORAL | Status: AC
  Administered 2021-04-14: 1000 mg via ORAL
  Filled 2021-04-14: qty 2

## 2021-04-14 MED ORDER — POTASSIUM CHLORIDE CRYS ER 20 MEQ PO TBCR
40.0000 meq | EXTENDED_RELEASE_TABLET | Freq: Once | ORAL | Status: AC
  Administered 2021-04-14: 40 meq via ORAL
  Filled 2021-04-14: qty 2

## 2021-04-14 MED ORDER — IPRATROPIUM BROMIDE 0.02 % IN SOLN
0.5000 mg | Freq: Once | RESPIRATORY_TRACT | Status: AC
  Administered 2021-04-14: 0.5 mg via RESPIRATORY_TRACT
  Filled 2021-04-14: qty 2.5

## 2021-04-14 MED ORDER — ALBUTEROL SULFATE (2.5 MG/3ML) 0.083% IN NEBU: 2.5000 mg | INHALATION_SOLUTION | RESPIRATORY_TRACT | Status: DC

## 2021-04-14 MED ORDER — ALBUTEROL (5 MG/ML) CONTINUOUS INHALATION SOLN
INHALATION_SOLUTION | RESPIRATORY_TRACT | Status: AC
  Filled 2021-04-14: qty 2

## 2021-04-14 MED ORDER — ALBUTEROL (5 MG/ML) CONTINUOUS INHALATION SOLN: 10.0000 mg/h | INHALATION_SOLUTION | RESPIRATORY_TRACT | Status: DC

## 2021-04-14 MED ORDER — METHYLPREDNISOLONE SODIUM SUCC 125 MG IJ SOLR
125.0000 mg | Freq: Once | INTRAMUSCULAR | Status: AC
  Administered 2021-04-14: 125 mg via INTRAVENOUS
  Filled 2021-04-14: qty 2

## 2021-04-14 MED ORDER — ALBUTEROL SULFATE (2.5 MG/3ML) 0.083% IN NEBU
2.5000 mg | INHALATION_SOLUTION | Freq: Once | RESPIRATORY_TRACT | Status: AC
  Administered 2021-04-14: 2.5 mg via RESPIRATORY_TRACT
  Filled 2021-04-14: qty 3

## 2021-04-14 NOTE — ED Triage Notes (Addendum)
Pt presents with shob x 2 days, worsened today. Took albuterol tx 2 hours ago with no relief. Also c/o chest tightness and productive cough. Hx of copd. Current smoker (4 - 5 cigarettes per day), but did not smoke today. Being followed by pulmonologist. Appt on Thursday.  ? ?88% on RA. RT at bedside. Not on home o2 per pt.  ?

## 2021-04-14 NOTE — ED Provider Notes (Signed)
? ?Emergency Department Provider Note ? ? ?I have reviewed the triage vital signs and the nursing notes. ? ? ?HISTORY ? ?Chief Complaint ?Shortness of Breath ? ? ?HPI ?Dawn Moses is a 61 y.o. female with past medical history of asthma, sarcoid, COPD, hypertension presents with shortness of breath and wheezing.  Describes 4 days of developing shortness of breath which has been particularly bad in the last 2 days.  She has been using her albuterol at home with minimal relief.  She has some associated chest tightness along with productive cough.  She has an appointment with pulmonology on Thursday but ultimately decided she cannot make that appointment due to her worsening symptoms. ? ? ?Past Medical History:  ?Diagnosis Date  ? Asthma   ? COPD (chronic obstructive pulmonary disease) (HCC)   ? Hypertension   ? Sarcoidosis   ? ? ?Review of Systems ? ?Constitutional: No fever/chills ?Eyes: No visual changes. ?ENT: No sore throat. ?Cardiovascular: Positive chest pain. ?Respiratory: Positive shortness of breath. ?Gastrointestinal: No abdominal pain.  No nausea, no vomiting.  No diarrhea.  No constipation. ?Genitourinary: Negative for dysuria. ?Musculoskeletal: Negative for back pain. ?Skin: Negative for rash. ?Neurological: Negative for headaches, focal weakness or numbness. ? ?____________________________________________ ? ? ?PHYSICAL EXAM: ? ?VITAL SIGNS: ?ED Triage Vitals  ?Enc Vitals Group  ?   BP 04/14/21 1923 (!) 156/116  ?   Pulse Rate 04/14/21 1923 (!) 120  ?   Resp 04/14/21 1923 (!) 24  ?   Temp 04/14/21 1923 98.4 ?F (36.9 ?C)  ?   Temp Source 04/14/21 1923 Oral  ?   SpO2 04/14/21 1923 90 %  ?   Weight 04/14/21 1939 132 lb 0.9 oz (59.9 kg)  ?   Height 04/14/21 1939 5\' 6"  (1.676 m)  ? ?Constitutional: Alert and oriented. Increased WOB on arrival. Speaking in 1-2 word sentences. ?Eyes: Conjunctivae are normal.  ?Head: Atraumatic. ?Nose: No congestion/rhinnorhea. ?Mouth/Throat: Mucous membranes are moist.    ?Neck: No stridor.   ?Cardiovascular: Tachycardia. Good peripheral circulation. Grossly normal heart sounds.   ?Respiratory: Increased respiratory effort.  No retractions. Lungs are tight with end-expiratory wheezing.  ?Gastrointestinal: Soft and nontender. No distention.  ?Musculoskeletal: No lower extremity tenderness nor edema. No gross deformities of extremities. ?Neurologic:  Normal speech and language.  ?Skin:  Skin is warm, dry and intact. No rash noted. ? ? ?____________________________________________ ?  ?LABS ?(all labs ordered are listed, but only abnormal results are displayed) ? ?Labs Reviewed  ?BASIC METABOLIC PANEL - Abnormal; Notable for the following components:  ?    Result Value  ? Potassium 3.1 (*)   ? Glucose, Bld 139 (*)   ? All other components within normal limits  ?CBC - Abnormal; Notable for the following components:  ? WBC 11.4 (*)   ? RBC 3.82 (*)   ? All other components within normal limits  ?RESP PANEL BY RT-PCR (FLU A&B, COVID) [id number]  ?TROPONIN I (HIGH SENSITIVITY)  ?TROPONIN I (HIGH SENSITIVITY)  ? ?____________________________________________ ? ?EKG ? ? EKG Interpretation ? ?Date/Time:  Tuesday April 14 2021 19:25:52 EDT ?Ventricular Rate:  115 ?PR Interval:  158 ?QRS Duration: 88 ?QT Interval:  323 ?QTC Calculation: 447 ?R Axis:   98 ?Text Interpretation: Sinus tachycardia Right atrial enlargement Borderline right axis deviation Confirmed by 03-22-1987 [phone]) on 04/14/2021 8:54:25 PM ?  ? ?  ? ? ?____________________________________________ ? ?RADIOLOGY ? ?DG Chest Port 1 View ? ?Result Date: 04/14/2021 ?CLINICAL DATA:  Shortness of breath.  Sarcoidosis. EXAM: PORTABLE CHEST 1 VIEW COMPARISON:  Chest radiograph dated 11/27/2020 and CT dated 03/19/2021. FINDINGS: Diffuse chronic interstitial coarsening and bilateral upper lobe reticular scarring in keeping with sarcoidosis. Scattered small nodular densities better seen on the prior CT. No lobar consolidation, pleural effusion, or  pneumothorax. The cardiac silhouette is within normal limits. No acute osseous pathology. IMPRESSION: No acute cardiopulmonary process. Electronically Signed   By: Elgie CollardArash  Radparvar M.D.   On: 04/14/2021 20:06   ? ?____________________________________________ ? ? ?PROCEDURES ? ?Procedure(s) performed:  ? ?Procedures ? ?CRITICAL CARE ?Performed by: Maia PlanJoshua G Selvin Yun ?Total critical care time: 35 minutes ?Critical care time was exclusive of separately billable procedures and treating other patients. ?Critical care was necessary to treat or prevent imminent or life-threatening deterioration. ?Critical care was time spent personally by me on the following activities: development of treatment plan with patient and/or surrogate as well as nursing, discussions with consultants, evaluation of patient's response to treatment, examination of patient, obtaining history from patient or surrogate, ordering and performing treatments and interventions, ordering and review of laboratory studies, ordering and review of radiographic studies, pulse oximetry and re-evaluation of patient's condition. ? ?Alona BeneJoshua Minda Faas, MD ?Emergency Medicine ? ?____________________________________________ ? ? ?INITIAL IMPRESSION / ASSESSMENT AND PLAN / ED COURSE ? ?Pertinent labs & imaging results that were available during my care of the patient were reviewed by me and considered in my medical decision making (see chart for details). ?  ?This patient is Presenting for Evaluation of CP, which does require a range of treatment options, and is a complaint that involves a high risk of morbidity and mortality. ? ?The Differential Diagnoses includes but is not exclusive to acute coronary syndrome, aortic dissection, pulmonary embolism, cardiac tamponade, community-acquired pneumonia, pericarditis, musculoskeletal chest wall pain, etc. ? ? ?Critical Interventions-  ?  ?Medications  ?albuterol (PROVENTIL,VENTOLIN) solution continuous neb (has no administration in time  range)  ?albuterol (VENTOLIN) (5 MG/ML) 0.5% continuous inhalation solution (has no administration in time range)  ?potassium chloride SA (KLOR-CON M) CR tablet 40 mEq (has no administration in time range)  ?albuterol (PROVENTIL) (2.5 MG/3ML) 0.083% nebulizer solution 2.5 mg (2.5 mg Nebulization Given 04/14/21 1931)  ?ipratropium-albuterol (DUONEB) 0.5-2.5 (3) MG/3ML nebulizer solution 3 mL (3 mLs Nebulization Given 04/14/21 1931)  ?ipratropium (ATROVENT) nebulizer solution 0.5 mg (0.5 mg Nebulization Given 04/14/21 2002)  ?methylPREDNISolone sodium succinate (SOLU-MEDROL) 125 mg/2 mL injection 125 mg (125 mg Intravenous Given 04/14/21 1957)  ?magnesium sulfate IVPB 1 g 100 mL (1 g Intravenous New Bag/Given 04/14/21 2103)  ?acetaminophen (TYLENOL) tablet 1,000 mg (1,000 mg Oral Given 04/14/21 2107)  ? ? ?Reassessment after intervention:  Patient symptoms improving.  ? ? ?I decided to review pertinent External Data, and in summary last ED visits in Nov 2022 with similar symptoms. ?  ?Clinical Laboratory Tests Ordered, included COVID and flu PCR negative.  Basic metabolic with normal kidney function.  Potassium slightly low at 3.1 likely related to nebulizer treatments.  Troponin is normal at 8. No anemia.  ? ?Radiologic Tests Ordered, included CXR. I independently interpreted the images and agree with radiology interpretation.  ? ?Cardiac Monitor Tracing which shows sinus tachycardia. ? ? ?Social Determinants of Health Risk patient with a smoking history.  ? ?Consult complete with Hospitalist ? ?Discussed patient's case with [facility], Dr. Margo AyeHall to request admission. Patient and family (if present) updated with plan. ? ? ?Medical Decision Making: Summary:  ?Patient presents emergency department for evaluation of shortness of breath with wheezing.  She  has a new oxygen requirement.  She has some associated chest tightness but no pleuritic chest pain.  She is afebrile.  Tachycardia likely related to prior nebs and nebulizers given  here in the ED. wheezing in all lung field.  Very low suspicion for PE or ACS clinically.  ? ?Reevaluation with update and discussion with patient after continuous nebulizer. Symptoms improved but not ba

## 2021-04-14 NOTE — ED Notes (Signed)
Report given to David with [facility] 

## 2021-04-15 ENCOUNTER — Encounter (HOSPITAL_COMMUNITY): Payer: Self-pay | Admitting: Internal Medicine

## 2021-04-15 DIAGNOSIS — Z880 Allergy status to penicillin: Secondary | ICD-10-CM | POA: Diagnosis not present

## 2021-04-15 DIAGNOSIS — Z888 Allergy status to other drugs, medicaments and biological substances status: Secondary | ICD-10-CM | POA: Diagnosis not present

## 2021-04-15 DIAGNOSIS — F17210 Nicotine dependence, cigarettes, uncomplicated: Secondary | ICD-10-CM | POA: Diagnosis present

## 2021-04-15 DIAGNOSIS — J441 Chronic obstructive pulmonary disease with (acute) exacerbation: Secondary | ICD-10-CM

## 2021-04-15 DIAGNOSIS — Z79899 Other long term (current) drug therapy: Secondary | ICD-10-CM | POA: Diagnosis not present

## 2021-04-15 DIAGNOSIS — Z91018 Allergy to other foods: Secondary | ICD-10-CM | POA: Diagnosis not present

## 2021-04-15 DIAGNOSIS — M609 Myositis, unspecified: Secondary | ICD-10-CM | POA: Diagnosis present

## 2021-04-15 DIAGNOSIS — J9601 Acute respiratory failure with hypoxia: Secondary | ICD-10-CM

## 2021-04-15 DIAGNOSIS — M797 Fibromyalgia: Secondary | ICD-10-CM | POA: Diagnosis present

## 2021-04-15 DIAGNOSIS — N179 Acute kidney failure, unspecified: Secondary | ICD-10-CM | POA: Diagnosis present

## 2021-04-15 DIAGNOSIS — K5900 Constipation, unspecified: Secondary | ICD-10-CM | POA: Diagnosis present

## 2021-04-15 DIAGNOSIS — Z20822 Contact with and (suspected) exposure to covid-19: Secondary | ICD-10-CM | POA: Diagnosis present

## 2021-04-15 DIAGNOSIS — I10 Essential (primary) hypertension: Secondary | ICD-10-CM | POA: Diagnosis present

## 2021-04-15 DIAGNOSIS — G43909 Migraine, unspecified, not intractable, without status migrainosus: Secondary | ICD-10-CM | POA: Diagnosis present

## 2021-04-15 DIAGNOSIS — E875 Hyperkalemia: Secondary | ICD-10-CM | POA: Diagnosis present

## 2021-04-15 DIAGNOSIS — K861 Other chronic pancreatitis: Secondary | ICD-10-CM | POA: Diagnosis present

## 2021-04-15 DIAGNOSIS — Z881 Allergy status to other antibiotic agents status: Secondary | ICD-10-CM | POA: Diagnosis not present

## 2021-04-15 DIAGNOSIS — E876 Hypokalemia: Secondary | ICD-10-CM | POA: Diagnosis present

## 2021-04-15 DIAGNOSIS — T502X5A Adverse effect of carbonic-anhydrase inhibitors, benzothiadiazides and other diuretics, initial encounter: Secondary | ICD-10-CM | POA: Diagnosis present

## 2021-04-15 DIAGNOSIS — D860 Sarcoidosis of lung: Secondary | ICD-10-CM | POA: Diagnosis present

## 2021-04-15 DIAGNOSIS — J4541 Moderate persistent asthma with (acute) exacerbation: Secondary | ICD-10-CM | POA: Diagnosis present

## 2021-04-15 LAB — BASIC METABOLIC PANEL
Anion gap: 8 (ref 5–15)
BUN: 18 mg/dL (ref 6–20)
CO2: 26 mmol/L (ref 22–32)
Calcium: 9.5 mg/dL (ref 8.9–10.3)
Chloride: 102 mmol/L (ref 98–111)
Creatinine, Ser: 1.38 mg/dL — ABNORMAL HIGH (ref 0.44–1.00)
GFR, Estimated: 44 mL/min — ABNORMAL LOW (ref >60)
Glucose, Bld: 164 mg/dL — ABNORMAL HIGH (ref 70–99)
Potassium: 4 mmol/L (ref 3.5–5.1)
Sodium: 136 mmol/L (ref 135–145)

## 2021-04-15 LAB — EXPECTORATED SPUTUM ASSESSMENT W GRAM STAIN, RFLX TO RESP C

## 2021-04-15 LAB — CBC
HCT: 35.9 % — ABNORMAL LOW (ref 36.0–46.0)
Hemoglobin: 12.1 g/dL (ref 12.0–15.0)
MCH: 33.4 pg (ref 26.0–34.0)
MCHC: 33.7 g/dL (ref 30.0–36.0)
MCV: 99.2 fL (ref 80.0–100.0)
Platelets: 243 10*3/uL (ref 150–400)
RBC: 3.62 MIL/uL — ABNORMAL LOW (ref 3.87–5.11)
RDW: 12.6 % (ref 11.5–15.5)
WBC: 8.2 10*3/uL (ref 4.0–10.5)
nRBC: 0 % (ref 0.0–0.2)

## 2021-04-15 LAB — MAGNESIUM: Magnesium: 2 mg/dL (ref 1.7–2.4)

## 2021-04-15 LAB — HIV ANTIBODY (ROUTINE TESTING W REFLEX): HIV Screen 4th Generation wRfx: NONREACTIVE

## 2021-04-15 MED ORDER — KETOROLAC TROMETHAMINE 30 MG/ML IJ SOLN
30.0000 mg | Freq: Once | INTRAMUSCULAR | Status: AC
  Administered 2021-04-15: 30 mg via INTRAVENOUS
  Filled 2021-04-15: qty 1

## 2021-04-15 MED ORDER — BUDESONIDE 0.25 MG/2ML IN SUSP
0.2500 mg | Freq: Two times a day (BID) | RESPIRATORY_TRACT | Status: DC
  Administered 2021-04-15 – 2021-04-19 (×9): 0.25 mg via RESPIRATORY_TRACT
  Filled 2021-04-15 (×9): qty 2

## 2021-04-15 MED ORDER — FAMOTIDINE 20 MG PO TABS
20.0000 mg | ORAL_TABLET | Freq: Two times a day (BID) | ORAL | Status: DC
  Administered 2021-04-15 – 2021-04-19 (×9): 20 mg via ORAL
  Filled 2021-04-15 (×9): qty 1

## 2021-04-15 MED ORDER — TRAMADOL HCL 50 MG PO TABS
50.0000 mg | ORAL_TABLET | Freq: Once | ORAL | Status: AC
  Administered 2021-04-15: 50 mg via ORAL
  Filled 2021-04-15: qty 1

## 2021-04-15 MED ORDER — IBUPROFEN 400 MG PO TABS
600.0000 mg | ORAL_TABLET | Freq: Once | ORAL | Status: AC
  Administered 2021-04-15: 600 mg via ORAL
  Filled 2021-04-15: qty 1

## 2021-04-15 MED ORDER — HYDROCHLOROTHIAZIDE 25 MG PO TABS
25.0000 mg | ORAL_TABLET | Freq: Every day | ORAL | Status: DC
  Administered 2021-04-15 – 2021-04-17 (×3): 25 mg via ORAL
  Filled 2021-04-15 (×3): qty 1

## 2021-04-15 MED ORDER — DULOXETINE HCL 60 MG PO CPEP
60.0000 mg | ORAL_CAPSULE | Freq: Every day | ORAL | Status: DC
  Administered 2021-04-15 – 2021-04-19 (×5): 60 mg via ORAL
  Filled 2021-04-15 (×5): qty 1

## 2021-04-15 MED ORDER — POTASSIUM CHLORIDE CRYS ER 20 MEQ PO TBCR
40.0000 meq | EXTENDED_RELEASE_TABLET | Freq: Once | ORAL | Status: AC
  Administered 2021-04-15: 40 meq via ORAL
  Filled 2021-04-15: qty 2

## 2021-04-15 MED ORDER — IPRATROPIUM-ALBUTEROL 0.5-2.5 (3) MG/3ML IN SOLN
3.0000 mL | RESPIRATORY_TRACT | Status: DC
  Administered 2021-04-15 (×4): 3 mL via RESPIRATORY_TRACT
  Filled 2021-04-15 (×4): qty 3

## 2021-04-15 MED ORDER — IPRATROPIUM-ALBUTEROL 0.5-2.5 (3) MG/3ML IN SOLN: 3.0000 mL | RESPIRATORY_TRACT | Status: DC | PRN

## 2021-04-15 MED ORDER — HYDROCODONE-ACETAMINOPHEN 5-325 MG PO TABS
1.0000 | ORAL_TABLET | Freq: Three times a day (TID) | ORAL | Status: DC | PRN
  Administered 2021-04-15 – 2021-04-19 (×9): 1 via ORAL
  Filled 2021-04-15 (×10): qty 1

## 2021-04-15 MED ORDER — GABAPENTIN 300 MG PO CAPS
300.0000 mg | ORAL_CAPSULE | Freq: Two times a day (BID) | ORAL | Status: DC
  Administered 2021-04-15 – 2021-04-19 (×9): 300 mg via ORAL
  Filled 2021-04-15 (×9): qty 1

## 2021-04-15 MED ORDER — LORATADINE 10 MG PO TABS
10.0000 mg | ORAL_TABLET | Freq: Every day | ORAL | Status: DC
  Administered 2021-04-15 – 2021-04-19 (×5): 10 mg via ORAL
  Filled 2021-04-15 (×5): qty 1

## 2021-04-15 MED ORDER — MONTELUKAST SODIUM 10 MG PO TABS
10.0000 mg | ORAL_TABLET | Freq: Every day | ORAL | Status: DC
  Administered 2021-04-15 – 2021-04-18 (×4): 10 mg via ORAL
  Filled 2021-04-15 (×5): qty 1

## 2021-04-15 MED ORDER — ONDANSETRON 4 MG PO TBDP
4.0000 mg | ORAL_TABLET | Freq: Once | ORAL | Status: AC
  Administered 2021-04-15: 4 mg via ORAL
  Filled 2021-04-15: qty 1

## 2021-04-15 MED ORDER — TIZANIDINE HCL 4 MG PO TABS
2.0000 mg | ORAL_TABLET | Freq: Once | ORAL | Status: AC
  Administered 2021-04-15: 2 mg via ORAL
  Filled 2021-04-15: qty 1

## 2021-04-15 MED ORDER — ATORVASTATIN CALCIUM 20 MG PO TABS
20.0000 mg | ORAL_TABLET | Freq: Every day | ORAL | Status: DC
  Administered 2021-04-15 – 2021-04-18 (×4): 20 mg via ORAL
  Filled 2021-04-15 (×4): qty 1

## 2021-04-15 MED ORDER — IRBESARTAN 150 MG PO TABS
150.0000 mg | ORAL_TABLET | Freq: Every day | ORAL | Status: DC
Start: 2021-04-15 — End: 2021-04-17
  Administered 2021-04-15 – 2021-04-16 (×2): 150 mg via ORAL
  Filled 2021-04-15 (×2): qty 1

## 2021-04-15 MED ORDER — SODIUM CHLORIDE 0.9 % IV SOLN
500.0000 mg | INTRAVENOUS | Status: DC
  Administered 2021-04-15 – 2021-04-19 (×5): 500 mg via INTRAVENOUS
  Filled 2021-04-15 (×5): qty 5

## 2021-04-15 MED ORDER — ENOXAPARIN SODIUM 40 MG/0.4ML IJ SOSY
40.0000 mg | PREFILLED_SYRINGE | INTRAMUSCULAR | Status: DC
  Administered 2021-04-15 – 2021-04-19 (×5): 40 mg via SUBCUTANEOUS
  Filled 2021-04-15 (×5): qty 0.4

## 2021-04-15 MED ORDER — ACETAMINOPHEN 650 MG RE SUPP: 650.0000 mg | Freq: Four times a day (QID) | RECTAL | Status: DC | PRN

## 2021-04-15 MED ORDER — METHYLPREDNISOLONE SODIUM SUCC 40 MG IJ SOLR
40.0000 mg | Freq: Two times a day (BID) | INTRAMUSCULAR | Status: DC
  Administered 2021-04-15 – 2021-04-18 (×7): 40 mg via INTRAVENOUS
  Filled 2021-04-15 (×7): qty 1

## 2021-04-15 MED ORDER — NICOTINE 14 MG/24HR TD PT24
14.0000 mg | MEDICATED_PATCH | Freq: Every day | TRANSDERMAL | Status: DC
  Administered 2021-04-15 – 2021-04-19 (×5): 14 mg via TRANSDERMAL
  Filled 2021-04-15 (×6): qty 1

## 2021-04-15 MED ORDER — ACETAMINOPHEN 325 MG PO TABS
650.0000 mg | ORAL_TABLET | Freq: Four times a day (QID) | ORAL | Status: DC | PRN
  Filled 2021-04-15: qty 2

## 2021-04-15 MED ORDER — PANCRELIPASE (LIP-PROT-AMYL) 12000-38000 UNITS PO CPEP
24000.0000 [IU] | ORAL_CAPSULE | Freq: Three times a day (TID) | ORAL | Status: DC
  Administered 2021-04-15 – 2021-04-19 (×12): 24000 [IU] via ORAL
  Filled 2021-04-15 (×11): qty 2

## 2021-04-15 MED ORDER — IPRATROPIUM-ALBUTEROL 0.5-2.5 (3) MG/3ML IN SOLN
3.0000 mL | Freq: Four times a day (QID) | RESPIRATORY_TRACT | Status: DC
  Administered 2021-04-15 – 2021-04-19 (×14): 3 mL via RESPIRATORY_TRACT
  Filled 2021-04-15 (×14): qty 3

## 2021-04-15 NOTE — H&P (Addendum)
History and Physical    Dawn Moses ZOX:[id number] DOB: 07-Nov-1960 DOA: 04/14/2021  PCP: Greer Ee., FNP  Patient coming from: Home.  Chief Complaint: Shortness of breath.  HPI: Dawn Moses is a 60 y.o. female with history of COPD with ongoing tobacco abuse, sarcoidosis, fibromyalgia, hypertension presents to the ER with complaints of having increasing shortness of breath ongoing for the last 1 week.  Denies any chest pain has been having increasing productive cough.  Patient has an appointment with her pulmonologist this week but due to worsening shortness of breath decided to come to the ER at med center.  ED Course: In the ER patient was hypoxic requiring 2 L oxygen and diffusely wheezing chest x-ray did not show anything acute.  COVID test is negative.  Flu test was negative.  Given hypoxia with ongoing shortness of breath tachypnea diffuse wheezing admitted for further management.  Labs show mild hypokalemia.  High sensitive troponins were negative.  EKG shows sinus tachycardia.  Review of Systems: As per HPI, rest all negative.   Past Medical History:  Diagnosis Date   Asthma    COPD (chronic obstructive pulmonary disease) (HCC)    Hypertension    Sarcoidosis     Past Surgical History:  Procedure Laterality Date   FOOT FUSION     HERNIA REPAIR     THROAT SURGERY     TONSILLECTOMY       reports that she has been smoking cigarettes. She has been smoking an average of .5 packs per day. She does not have any smokeless tobacco history on file. She reports that she does not drink alcohol and does not use drugs.  Allergies  Allergen Reactions   Chocolate Itching   Penicillins Hives    No family history on file.  Prior to Admission medications   Medication Sig Start Date End Date Taking? Authorizing Provider  albuterol (PROVENTIL HFA;VENTOLIN HFA) 108 (90 BASE) MCG/ACT inhaler Inhale into the lungs every 6 (six) hours as needed for wheezing or shortness of  breath.    [provider]  albuterol (PROVENTIL) (2.5 MG/3ML) 0.083% nebulizer solution Take 3 mLs (2.5 mg total) by nebulization every 6 (six) hours as needed for wheezing or shortness of breath. 11/27/20   Jacalyn Lefevre, MD  chlorpheniramine-HYDROcodone (TUSSIONEX PENNKINETIC ER) 10-8 MG/5ML SUER Take 5 mLs by mouth every 12 (twelve) hours as needed for cough. 11/27/20   Jacalyn Lefevre, MD  fluticasone (FLONASE) 50 MCG/ACT nasal spray Place 1 spray into both nostrils daily.    [provider]  Fluticasone-Salmeterol (ADVAIR HFA IN) Inhale into the lungs.    [provider]  hydrochlorothiazide (HYDRODIURIL) 12.5 MG tablet Take 12.5 mg by mouth daily.    [provider]  LISINOPRIL PO Take by mouth.    [provider]  methocarbamol (ROBAXIN) 500 MG tablet Take 1 tablet (500 mg total) by mouth 2 (two) times daily. 03/27/13   Janne Napoleon, NP  omeprazole (PRILOSEC) 20 MG capsule Take 1 capsule (20 mg total) by mouth daily. 10/13/13   Palumbo, April, MD  ondansetron (ZOFRAN ODT) 8 MG disintegrating tablet 8mg  ODT q8 hours prn nausea 10/13/13   Palumbo, April, MD  predniSONE (DELTASONE) 10 MG tablet Take by mouth daily. Take 6 tabs by mouth daily  for 2 days, then 5 tabs for 2 days, then 4 tabs for 2 days, then 3 tabs for 2 days, 2 tabs for 2 days, then 1 tab by mouth daily for 2  days 11/27/20   Jacalyn Lefevre, MD    Physical Exam: Constitutional: Moderately built and nourished. Vitals:   04/14/21 2245 04/14/21 2300 04/15/21 0015 04/15/21 0103  BP: (!) 127/98 (!) 176/107 (!) 153/123 (!) 165/94  Pulse: (!) 103 97 91 88  Resp: 17 (!) 22 20 20   Temp:    98 F (36.7 C)  TempSrc:    Oral  SpO2: 97% 98% 99% 94%  Weight:   58.6 kg   Height:   5\' 6"  (1.676 m)    Eyes: Anicteric no pallor. ENMT: No discharge from the ears eyes nose and mouth. Neck: No mass felt.  No neck rigidity. Respiratory: Bilateral expiratory wheeze and no  crepitations. Cardiovascular: S1-S2 heard. Abdomen: Soft nontender bowel sounds present. Musculoskeletal: No edema. Skin: No rash. Neurologic: Alert awake oriented time place and person.  Moves all extremities. Psychiatric: Appears normal.  Normal affect.   Labs on Admission: I have personally reviewed following labs and imaging studies  CBC: Recent Labs  Lab 04/14/21 1933  WBC 11.4*  HGB 12.7  HCT 37.0  MCV 96.9  PLT 262   Basic Metabolic Panel: Recent Labs  Lab 04/14/21 1933  NA 139  K 3.1*  CL 103  CO2 27  GLUCOSE 139*  BUN 11  CREATININE 0.91  CALCIUM 9.3   GFR: Estimated Creatinine Clearance: 61.6 mL/min (by C-G formula based on SCr of 0.91 mg/dL). Liver Function Tests: No results for input(s): AST, ALT, ALKPHOS, BILITOT, PROT, ALBUMIN in the last 168 hours. No results for input(s): LIPASE, AMYLASE in the last 168 hours. No results for input(s): AMMONIA in the last 168 hours. Coagulation Profile: No results for input(s): INR, PROTIME in the last 168 hours. Cardiac Enzymes: No results for input(s): CKTOTAL, CKMB, CKMBINDEX, TROPONINI in the last 168 hours. BNP (last 3 results) No results for input(s): PROBNP in the last 8760 hours. HbA1C: No results for input(s): HGBA1C in the last 72 hours. CBG: No results for input(s): GLUCAP in the last 168 hours. Lipid Profile: No results for input(s): CHOL, HDL, LDLCALC, TRIG, CHOLHDL, LDLDIRECT in the last 72 hours. Thyroid Function Tests: No results for input(s): TSH, T4TOTAL, FREET4, T3FREE, THYROIDAB in the last 72 hours. Anemia Panel: No results for input(s): VITAMINB12, FOLATE, FERRITIN, TIBC, IRON, RETICCTPCT in the last 72 hours. Urine analysis:    Component Value Date/Time   COLORURINE YELLOW 10/13/2013 0233   APPEARANCEUR CLEAR 10/13/2013 0233   LABSPEC 1.014 10/13/2013 0233   PHURINE 6.0 10/13/2013 0233   GLUCOSEU NEGATIVE 10/13/2013 0233   HGBUR NEGATIVE 10/13/2013 0233   BILIRUBINUR NEGATIVE  10/13/2013 0233   KETONESUR NEGATIVE 10/13/2013 0233   PROTEINUR NEGATIVE 10/13/2013 0233   UROBILINOGEN 1.0 10/13/2013 0233   NITRITE NEGATIVE 10/13/2013 0233   LEUKOCYTESUR NEGATIVE 10/13/2013 0233   Sepsis Labs: @LABRCNTIP (procalcitonin:4,lacticidven:4) ) Recent Results (from the past 240 hour(s))  Resp Panel by RT-PCR (Flu A&B, Covid) Nasopharyngeal Swab     Status: None   Collection Time: 04/14/21  7:59 PM   Specimen: Nasopharyngeal Swab; Nasopharyngeal(NP) swabs in vial transport medium  Result Value Ref Range Status   SARS Coronavirus 2 by RT PCR NEGATIVE NEGATIVE Final    Comment: (NOTE) SARS-CoV-2 target nucleic acids are NOT DETECTED.  The SARS-CoV-2 RNA is generally detectable in upper respiratory specimens during the acute phase of infection. The lowest concentration of SARS-CoV-2 viral copies this assay can detect is 138 copies/mL. A negative result does not preclude SARS-Cov-2 infection and should not be used as  the sole basis for treatment or other patient management decisions. A negative result may occur with  improper specimen collection/handling, submission of specimen other than nasopharyngeal swab, presence of viral mutation(s) within the areas targeted by this assay, and inadequate number of viral copies(<138 copies/mL). A negative result must be combined with clinical observations, patient history, and epidemiological information. The expected result is Negative.  Fact Sheet for Patients:  BloggerCourse.com  Fact Sheet for Healthcare Providers:  SeriousBroker.it  This test is no t yet approved or cleared by the [place] FDA and  has been authorized for detection and/or diagnosis of SARS-CoV-2 by FDA under an Emergency Use Authorization (EUA). This EUA will remain  in effect (meaning this test can be used) for the duration of the COVID-19 declaration under Section 564(b)(1) of the Act,  21 U.S.C.section 360bbb-3(b)(1), unless the authorization is terminated  or revoked sooner.       Influenza A by PCR NEGATIVE NEGATIVE Final   Influenza B by PCR NEGATIVE NEGATIVE Final    Comment: (NOTE) The Xpert Xpress SARS-CoV-2/FLU/RSV plus assay is intended as an aid in the diagnosis of influenza from Nasopharyngeal swab specimens and should not be used as a sole basis for treatment. Nasal washings and aspirates are unacceptable for Xpert Xpress SARS-CoV-2/FLU/RSV testing.  Fact Sheet for Patients: BloggerCourse.com  Fact Sheet for Healthcare Providers: SeriousBroker.it  This test is not yet approved or cleared by the [place] FDA and has been authorized for detection and/or diagnosis of SARS-CoV-2 by FDA under an Emergency Use Authorization (EUA). This EUA will remain in effect (meaning this test can be used) for the duration of the COVID-19 declaration under Section 564(b)(1) of the Act, 21 U.S.C. section 360bbb-3(b)(1), unless the authorization is terminated or revoked.  Performed at [facility], [address] [place], [place] [address]      Radiological Exams on Admission: DG Chest [facility] 1 View  Result Date: 04/14/2021 CLINICAL DATA:  Shortness of breath.  Sarcoidosis. EXAM: PORTABLE CHEST 1 VIEW COMPARISON:  Chest radiograph dated 11/27/2020 and CT dated 03/19/2021. FINDINGS: Diffuse chronic interstitial coarsening and bilateral upper lobe reticular scarring in keeping with sarcoidosis. Scattered small nodular densities better seen on the prior CT. No lobar consolidation, pleural effusion, or pneumothorax. The cardiac silhouette is within normal limits. No acute osseous pathology. IMPRESSION: No acute cardiopulmonary process. Electronically Signed   By: Elgie Collard M.D.   On: 04/14/2021 20:06    EKG: Independently reviewed.  Sinus tachycardia.  Assessment/Plan Principal Problem:   COPD  exacerbation (HCC)    COPD exacerbation -presently requiring 2 L oxygen at baseline patient does not require oxygen.  Patient is diffusely wheezing with productive cough.  We will keep patient on IV Solu-Medrol nebulizer Pulmicort and antibiotics.  Closely monitor respiratory status. History of pulmonary sarcoidosis -I think symptoms more likely from COPD exacerbation at this time.  We will continue to monitor. Hypertension on ARB and hydrochlorothiazide. Hypokalemia likely from diuretic use.  Replace recheck.  If potassium does not improve may have to hold hydrochlorothiazide.  Check magnesium with next lab draw. History of fibromyalgia on Cymbalta, gabapentin and hydrocodone. 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 Chronic pancreatitis on Creon.  No abdominal pain at this time. Tobacco abuse -advised about quitting.  Since patient has acute respiratory failure with hypoxia secondary to COPD exacerbation needing close monitoring and inpatient status.   DVT prophylaxis: Lovenox. Code Status: Full code. Family Communication: Discussed with patient. Disposition Plan: Home.  Consults called: None. Admission status: Inpatient.   Eduard Clos MD [facility] Pager [phone].  If 7PM-7AM, please contact night-coverage [email or web address] Password [facility]  04/15/2021, 3:23 AM

## 2021-04-15 NOTE — ED Notes (Signed)
[facility] to bedside to assume care of patient. ?

## 2021-04-15 NOTE — Plan of Care (Signed)
  Problem: Education: Goal: Knowledge of General Education information will improve Description Including pain rating scale, medication(s)/side effects and non-pharmacologic comfort measures Outcome: Progressing   Problem: Education: Goal: Knowledge of disease or condition will improve Outcome: Progressing   

## 2021-04-15 NOTE — Progress Notes (Signed)
Subjective: ? ?Patient admitted this morning, see detailed H&P by Dr Toniann Fail ?61 year old female with a history of COPD with ongoing tobacco abuse, sarcoidosis, fibromyalgia, hypertension came to ED with complaints of increasing shortness of breath ongoing for past 1 week.  In the ED she was found to be hypoxemic requiring 2 L of oxygen.  Chest x-ray did not show anything acute.  COVID test was negative.  Patient admitted for acute hypoxemic respiratory failure ? ? ?Vitals:  ? 04/15/21 1121 04/15/21 1439  ?BP:  (!) 152/99  ?Pulse:  79  ?Resp:  19  ?Temp:  97.8 ?F (36.6 ?C)  ?SpO2: 98% 100%  ? ? ? ? ?A/P ? ?Acute hypoxemic respiratory failure ?-Secondary to COPD exacerbation ?-Started on IV Solu-Medrol, Pulmicort nebulizer and antibiotics ? ?Hypertension ?-Blood pressure stable, continue hydrochlorothiazide, ARB ? ?History of fibromyalgia ?-Continue Cymbalta, gabapentin, hydrocodone ? ?History of myositis ?-Followed by rheumatologist ? ?History of chronic pancreatitis ?-Continue Creon ? ? ? ? ?Meredeth Ide ?[facility] ? ?

## 2021-04-16 DIAGNOSIS — I10 Essential (primary) hypertension: Secondary | ICD-10-CM | POA: Diagnosis not present

## 2021-04-16 DIAGNOSIS — J4541 Moderate persistent asthma with (acute) exacerbation: Secondary | ICD-10-CM

## 2021-04-16 DIAGNOSIS — J441 Chronic obstructive pulmonary disease with (acute) exacerbation: Secondary | ICD-10-CM | POA: Diagnosis not present

## 2021-04-16 DIAGNOSIS — J9601 Acute respiratory failure with hypoxia: Secondary | ICD-10-CM | POA: Diagnosis not present

## 2021-04-16 LAB — BASIC METABOLIC PANEL
Anion gap: 8 (ref 5–15)
BUN: 33 mg/dL — ABNORMAL HIGH (ref 6–20)
CO2: 26 mmol/L (ref 22–32)
Calcium: 9.7 mg/dL (ref 8.9–10.3)
Chloride: 98 mmol/L (ref 98–111)
Creatinine, Ser: 0.98 mg/dL (ref 0.44–1.00)
GFR, Estimated: >60 mL/min (ref >60)
Glucose, Bld: 126 mg/dL — ABNORMAL HIGH (ref 70–99)
Potassium: 5.1 mmol/L (ref 3.5–5.1)
Sodium: 132 mmol/L — ABNORMAL LOW (ref 135–145)

## 2021-04-16 LAB — CBC
HCT: 36.6 % (ref 36.0–46.0)
Hemoglobin: 12.4 g/dL (ref 12.0–15.0)
MCH: 33.6 pg (ref 26.0–34.0)
MCHC: 33.9 g/dL (ref 30.0–36.0)
MCV: 99.2 fL (ref 80.0–100.0)
Platelets: 248 10*3/uL (ref 150–400)
RBC: 3.69 MIL/uL — ABNORMAL LOW (ref 3.87–5.11)
RDW: 12.4 % (ref 11.5–15.5)
WBC: 12.4 10*3/uL — ABNORMAL HIGH (ref 4.0–10.5)
nRBC: 0 % (ref 0.0–0.2)

## 2021-04-16 LAB — GLUCOSE, CAPILLARY: Glucose-Capillary: 104 mg/dL — ABNORMAL HIGH (ref 70–99)

## 2021-04-16 MED ORDER — POLYETHYLENE GLYCOL 3350 17 G PO PACK
17.0000 g | PACK | Freq: Every day | ORAL | Status: DC
  Administered 2021-04-16 – 2021-04-18 (×3): 17 g via ORAL
  Filled 2021-04-16 (×3): qty 1

## 2021-04-16 MED ORDER — SENNOSIDES-DOCUSATE SODIUM 8.6-50 MG PO TABS
1.0000 | ORAL_TABLET | Freq: Every evening | ORAL | Status: DC | PRN
  Administered 2021-04-16 – 2021-04-17 (×2): 1 via ORAL
  Filled 2021-04-16 (×2): qty 1

## 2021-04-16 MED ORDER — HYDRALAZINE HCL 25 MG PO TABS
25.0000 mg | ORAL_TABLET | Freq: Four times a day (QID) | ORAL | Status: DC | PRN
  Administered 2021-04-16: 25 mg via ORAL
  Filled 2021-04-16: qty 1

## 2021-04-16 MED ORDER — BUTALBITAL-APAP-CAFFEINE 50-325-40 MG PO TABS
1.0000 | ORAL_TABLET | Freq: Four times a day (QID) | ORAL | Status: DC | PRN
  Administered 2021-04-16 – 2021-04-18 (×2): 1 via ORAL
  Filled 2021-04-16 (×2): qty 1

## 2021-04-16 MED ORDER — ONDANSETRON 4 MG PO TBDP
8.0000 mg | ORAL_TABLET | Freq: Three times a day (TID) | ORAL | Status: DC | PRN
  Administered 2021-04-16 – 2021-04-17 (×2): 8 mg via ORAL
  Filled 2021-04-16 (×2): qty 2

## 2021-04-16 MED ORDER — MELATONIN 5 MG PO TABS
5.0000 mg | ORAL_TABLET | Freq: Once | ORAL | Status: AC
  Administered 2021-04-16: 5 mg via ORAL
  Filled 2021-04-16: qty 1

## 2021-04-16 MED ORDER — BENZONATATE 100 MG PO CAPS
200.0000 mg | ORAL_CAPSULE | Freq: Three times a day (TID) | ORAL | Status: DC | PRN
  Administered 2021-04-16 (×2): 200 mg via ORAL
  Filled 2021-04-16 (×2): qty 2

## 2021-04-16 MED ORDER — KETOROLAC TROMETHAMINE 30 MG/ML IJ SOLN
30.0000 mg | Freq: Once | INTRAMUSCULAR | Status: AC
  Administered 2021-04-16: 30 mg via INTRAVENOUS
  Filled 2021-04-16: qty 1

## 2021-04-16 NOTE — Plan of Care (Signed)
?  Problem: Education: ?Goal: Knowledge of General Education information will improve ?Description: Including pain rating scale, medication(s)/side effects and non-pharmacologic comfort measures ?Outcome: Progressing ?  ?Problem: Education: ?Goal: Knowledge of disease or condition will improve ?Outcome: Progressing ?Goal: Knowledge of the prescribed therapeutic regimen will improve ?Outcome: Progressing ?  ?

## 2021-04-16 NOTE — Progress Notes (Signed)
[facility] ? ?PROGRESS NOTE ? ?Dawn Moses ZDG:[id number] DOB: [birth year] DOA: 04/14/2021 ?PCP: Greer Ee., FNP ? ? ?Brief HPI:   ?61 year old female with a history of COPD with ongoing tobacco abuse, sarcoidosis, fibromyalgia, hypertension came to ED with complaints of increasing shortness of breath ongoing for past 1 week.  In the ED she was found to be hypoxemic requiring 2 L of oxygen.  Chest x-ray did not show anything acute.  COVID test was negative.  Patient admitted for acute hypoxemic respiratory failure ? ? ? ?Subjective  ? ?Patient seen and examined, breathing better today.  Complains of constipation. ? ? Assessment/Plan:  ? ? ? ?Acute hypoxemic respiratory failure ?-Secondary to COPD exacerbation ?-Still requiring oxygen 3 L/min ?-Improved on Solu-Medrol, Pulmicort nebulizer ?-Continue Zithromax ? ?Hypertension ?-Blood pressure is mildly elevated ?-Continue irbesartan, HCTZ ?-We will start hydralazine as needed ? ?History of fibromyalgia ?-Continue Cymbalta, gabapentin, hydrocodone ? ?History of myositis ?-Followed by hematologist ? ?History of chronic pancreatitis ?-Continue Creon ? ? ? ?Medications ? ?  ? atorvastatin  20 mg Oral QHS  ? budesonide (PULMICORT) nebulizer solution  0.25 mg Nebulization BID  ? DULoxetine  60 mg Oral Daily  ? enoxaparin (LOVENOX) injection  40 mg Subcutaneous Q24H  ? famotidine  20 mg Oral BID  ? gabapentin  300 mg Oral BID  ? hydrochlorothiazide  25 mg Oral Daily  ? ipratropium-albuterol  3 mL Nebulization Q6H  ? irbesartan  150 mg Oral Daily  ? lipase/protease/amylase  24,000 Units Oral TID AC  ? loratadine  10 mg Oral Daily  ? methylPREDNISolone (SOLU-MEDROL) injection  40 mg Intravenous Q12H  ? montelukast  10 mg Oral QHS  ? nicotine  14 mg Transdermal Daily  ? polyethylene glycol  17 g Oral Daily  ? ? ? Data Reviewed:  ? ?CBG: ? ?No results for input(s): GLUCAP in the last 168 hours. ? ?SpO2: 98 % ?O2 Flow Rate (L/min): 3 L/min ?FiO2 (%): 100 %   ? ? ?Vitals:  ? 04/16/21 0437 04/16/21 0841 04/16/21 1433 04/16/21 1441  ?BP: 137/89   (!) 166/102  ?Pulse: 83   85  ?Resp: 18   15  ?Temp: 98.2 ?F (36.8 ?C)   98.3 ?F (36.8 ?C)  ?TempSrc: Oral   Oral  ?SpO2: 96% 95% 97% 98%  ?Weight:      ?Height:      ? ? ? ? ?Data Reviewed: ? ?Basic Metabolic Panel: ?Recent Labs  ?Lab 04/14/21 ?1933 04/15/21 ?0346 04/16/21 ?0411  ?NA 139 136 132*  ?K 3.1* 4.0 5.1  ?CL 103 102 98  ?CO2 27 26 26   ?GLUCOSE 139* 164* 126*  ?BUN 11 18 33*  ?CREATININE 0.91 1.38* 0.98  ?CALCIUM 9.3 9.5 9.7  ?MG  --  2.0  --   ? ? ?CBC: ?Recent Labs  ?Lab 04/14/21 ?1933 04/15/21 ?0346 04/16/21 ?0411  ?WBC 11.4* 8.2 12.4*  ?HGB 12.7 12.1 12.4  ?HCT 37.0 35.9* 36.6  ?MCV 96.9 99.2 99.2  ?PLT 262 243 248  ? ? ?LFT ?No results for input(s): AST, ALT, ALKPHOS, BILITOT, PROT, ALBUMIN in the last 168 hours. ?  ?Antibiotics: ?Anti-infectives (From admission, onward)  ? ? Start     Dose/Rate Route Frequency Ordered Stop  ? 04/15/21 0400  azithromycin (ZITHROMAX) 500 mg in sodium chloride 0.9 % 250 mL IVPB       ? 500 mg ?250 mL/hr over 60 Minutes Intravenous Every 24 hours 04/15/21 0328    ? ?  ? ? ? ?  DVT prophylaxis: Lovenox  ? ?Code Status: Full code ? ?Family Communication: No family at bedside ? ? ?CONSULTS  ? ? ?Objective  ? ? ?Physical Examination: ? ?General-appears in no acute distress ?Heart-S1-S2, regular, no murmur auscultated ?Lungs-scattered wheezing bilaterally ?Abdomen-soft, nontender, no organomegaly ?Extremities-no edema in the lower extremities ?Neuro-alert, oriented x3, no focal deficit noted ? ? ?Status is: Inpatient: COPD exacerbation ? ? ? ?  ? ?Meredeth Ide ?  ?[facility] ?If 7PM-7AM, please contact night-coverage at [email or web address], ?Office  [phone] ? ? ?04/16/2021, 2:49 PM  LOS: 1 day  ? ? ? ? ? ? ? ? ? ? ?  ?

## 2021-04-17 DIAGNOSIS — E875 Hyperkalemia: Secondary | ICD-10-CM

## 2021-04-17 LAB — BASIC METABOLIC PANEL
Anion gap: 5 (ref 5–15)
BUN: 39 mg/dL — ABNORMAL HIGH (ref 6–20)
CO2: 27 mmol/L (ref 22–32)
Calcium: 9.4 mg/dL (ref 8.9–10.3)
Chloride: 99 mmol/L (ref 98–111)
Creatinine, Ser: 1.2 mg/dL — ABNORMAL HIGH (ref 0.44–1.00)
GFR, Estimated: 52 mL/min — ABNORMAL LOW (ref >60)
Glucose, Bld: 127 mg/dL — ABNORMAL HIGH (ref 70–99)
Potassium: 5.8 mmol/L — ABNORMAL HIGH (ref 3.5–5.1)
Sodium: 131 mmol/L — ABNORMAL LOW (ref 135–145)

## 2021-04-17 LAB — GLUCOSE, CAPILLARY: Glucose-Capillary: 107 mg/dL — ABNORMAL HIGH (ref 70–99)

## 2021-04-17 MED ORDER — HYDROCHLOROTHIAZIDE 12.5 MG PO TABS
12.5000 mg | ORAL_TABLET | Freq: Every day | ORAL | Status: DC
  Administered 2021-04-18 – 2021-04-19 (×2): 12.5 mg via ORAL
  Filled 2021-04-17 (×2): qty 1

## 2021-04-17 MED ORDER — METOPROLOL TARTRATE 25 MG PO TABS
25.0000 mg | ORAL_TABLET | Freq: Two times a day (BID) | ORAL | Status: DC
  Administered 2021-04-17 – 2021-04-19 (×5): 25 mg via ORAL
  Filled 2021-04-17 (×5): qty 1

## 2021-04-17 MED ORDER — KETOROLAC TROMETHAMINE 30 MG/ML IJ SOLN
30.0000 mg | Freq: Once | INTRAMUSCULAR | Status: AC
  Administered 2021-04-17: 30 mg via INTRAVENOUS
  Filled 2021-04-17: qty 1

## 2021-04-17 MED ORDER — SODIUM ZIRCONIUM CYCLOSILICATE 5 G PO PACK
5.0000 g | PACK | Freq: Once | ORAL | Status: AC
  Administered 2021-04-17: 5 g via ORAL
  Filled 2021-04-17: qty 1

## 2021-04-17 MED ORDER — MELATONIN 5 MG PO TABS
10.0000 mg | ORAL_TABLET | Freq: Every day | ORAL | Status: DC
  Administered 2021-04-17 – 2021-04-18 (×2): 10 mg via ORAL
  Filled 2021-04-17 (×2): qty 2

## 2021-04-17 MED ORDER — SODIUM CHLORIDE 0.9 % IV SOLN: INTRAVENOUS | Status: AC

## 2021-04-17 MED ORDER — SODIUM ZIRCONIUM CYCLOSILICATE 5 G PO PACK
5.0000 g | PACK | Freq: Two times a day (BID) | ORAL | Status: AC
Start: 2021-04-17 — End: 2021-04-18
  Administered 2021-04-17 – 2021-04-18 (×2): 5 g via ORAL
  Filled 2021-04-17 (×2): qty 1

## 2021-04-17 NOTE — TOC Progression Note (Signed)
Transition of Care (TOC) - Progression Note  ? ? ?Patient Details  ?Name: Dawn Moses ?MRN: [id number] ?Date of Birth: 1961/01/04 ? ?Transition of Care (TOC) CM/SW Contact  ?Geni Bers, RN ?Phone Number: ?04/17/2021, 2:55 PM ? ?Clinical Narrative:    ? ?Pt from home and plan to return with no needs.  ? ?Expected Discharge Plan: Home/Self Care ?Barriers to Discharge: No Barriers Identified ? ?Expected Discharge Plan and Services ?Expected Discharge Plan: Home/Self Care ?  ?Discharge Planning Services: CM Consult ?  ?Living arrangements for the past 2 months: Single Family Home ?                ?  ?  ?  ?  ?  ?  ?  ?  ?  ?  ? ? ?Social Determinants of Health (SDOH) Interventions ?  ? ?Readmission Risk Interventions ?   ? View : No data to display.  ?  ?  ?  ? ? ?

## 2021-04-17 NOTE — Progress Notes (Signed)
[facility] ? ?PROGRESS NOTE ? ?Dawn Moses QIW:[id number] DOB: [birth year] DOA: 04/14/2021 ?PCP: Greer Ee., FNP ? ? ?Brief HPI:   ?61 year old female with a history of COPD with ongoing tobacco abuse, sarcoidosis, fibromyalgia, hypertension came to ED with complaints of increasing shortness of breath ongoing for past 1 week.  In the ED she was found to be hypoxemic requiring 2 L of oxygen.  Chest x-ray did not show anything acute.  COVID test was negative.  Patient admitted for acute hypoxemic respiratory failure ? ?Subjective  ? ?Patient seen and examined, breathing has improved.  Potassium is 5.8 today.  She received Toradol yesterday for migraine and wants to repeat the dose of Toradol today. ? ? Assessment/Plan:  ? ? ?Acute hypoxemic respiratory failure ?-Secondary to COPD exacerbation ?--Improved on Solu-Medrol, Pulmicort nebulizer ?-Now requiring 2 L/min of oxygen ?-Continue Zithromax ? ?Acute kidney injury ?-Mild, creatinine 1.20, BUN 39 ?-Start gentle IV hydration with normal saline 75 mill per hour for 12 hours ?-Follow BMP in am ? ?Hypertension ?-Blood pressure is mildly elevated ?-Continue HCTZ; we will cut down the dose of HCTZ to 12.5 mg daily  ?-will hold irbesartan due to hyperkalemia ?-Start metoprolol 25 mg p.o. twice daily ?-We will start hydralazine as needed ? ?Hyperkalemia ?-Potassium is 5.8 ?-We will give Lokelma 5 g p.o. twice daily for 2 doses ? ?History of fibromyalgia ?-Continue Cymbalta, gabapentin, hydrocodone ? ?History of myositis ?-Followed by hematologist ? ?History of chronic pancreatitis ?-Continue Creon ? ?Migraine ?-Started on Fioricet 1 tablet every 6 hours as needed ?-We will give 1 dose of Toradol 30 mg IV x1 ? ? ?Medications ? ?  ? atorvastatin  20 mg Oral QHS  ? budesonide (PULMICORT) nebulizer solution  0.25 mg Nebulization BID  ? DULoxetine  60 mg Oral Daily  ? enoxaparin (LOVENOX) injection  40 mg Subcutaneous Q24H  ? famotidine  20 mg Oral BID  ?  gabapentin  300 mg Oral BID  ? hydrochlorothiazide  25 mg Oral Daily  ? ipratropium-albuterol  3 mL Nebulization Q6H  ? lipase/protease/amylase  24,000 Units Oral TID AC  ? loratadine  10 mg Oral Daily  ? methylPREDNISolone (SOLU-MEDROL) injection  40 mg Intravenous Q12H  ? metoprolol tartrate  25 mg Oral BID  ? montelukast  10 mg Oral QHS  ? nicotine  14 mg Transdermal Daily  ? polyethylene glycol  17 g Oral Daily  ? sodium zirconium cyclosilicate  5 g Oral BID  ? ? ? Data Reviewed:  ? ?CBG: ? ?Recent Labs  ?Lab 04/16/21 ?2052 04/17/21 ?0732  ?GLUCAP 104* 107*  ? ? ?SpO2: 98 % ?O2 Flow Rate (L/min): 2 L/min ?FiO2 (%): 28 %  ? ? ?Vitals:  ? 04/17/21 0940 04/17/21 1026 04/17/21 1149 04/17/21 1411  ?BP:  (!) 152/91 (!) 168/95   ?Pulse:  89 69   ?Resp:    19  ?Temp:   97.7 ?F (36.5 ?C)   ?TempSrc:   Oral   ?SpO2: 99%  99% 98%  ?Weight:      ?Height:      ? ? ? ? ?Data Reviewed: ? ?Basic Metabolic Panel: ?Recent Labs  ?Lab 04/14/21 ?1933 04/15/21 ?4174 04/16/21 ?0411 04/17/21 ?0814  ?NA 139 136 132* 131*  ?K 3.1* 4.0 5.1 5.8*  ?CL 103 102 98 99  ?CO2 27 26 26 27   ?GLUCOSE 139* 164* 126* 127*  ?BUN 11 18 33* 39*  ?CREATININE 0.91 1.38* 0.98 1.20*  ?CALCIUM 9.3 9.5  9.7 9.4  ?MG  --  2.0  --   --   ? ? ?CBC: ?Recent Labs  ?Lab 04/14/21 ?1933 04/15/21 ?0346 04/16/21 ?0411  ?WBC 11.4* 8.2 12.4*  ?HGB 12.7 12.1 12.4  ?HCT 37.0 35.9* 36.6  ?MCV 96.9 99.2 99.2  ?PLT 262 243 248  ? ? ?LFT ?No results for input(s): AST, ALT, ALKPHOS, BILITOT, PROT, ALBUMIN in the last 168 hours. ?  ?Antibiotics: ?Anti-infectives (From admission, onward)  ? ? Start     Dose/Rate Route Frequency Ordered Stop  ? 04/15/21 0400  azithromycin (ZITHROMAX) 500 mg in sodium chloride 0.9 % 250 mL IVPB       ? 500 mg ?250 mL/hr over 60 Minutes Intravenous Every 24 hours 04/15/21 0328    ? ?  ? ? ? ?DVT prophylaxis: Lovenox  ? ?Code Status: Full code ? ?Family Communication: No family at bedside ? ? ?CONSULTS  ? ? ?Objective  ? ? ?Physical  Examination: ? ?General-appears in no acute distress ?Heart-S1-S2, regular, no murmur auscultated ?Lungs-scattered wheezing bilaterally ?Abdomen-soft, nontender, no organomegaly ?Extremities-no edema in the lower extremities ?Neuro-alert, oriented x3, no focal deficit noted ? ? ?Status is: Inpatient: COPD exacerbation ? ? ? ?  ? ?Meredeth Ide ?  ?[facility] ?If 7PM-7AM, please contact night-coverage at [email or web address], ?Office  [phone] ? ? ?04/17/2021, 2:58 PM  LOS: 2 days  ? ? ? ? ? ? ? ? ? ? ?  ?

## 2021-04-18 LAB — CULTURE, RESPIRATORY W GRAM STAIN: Culture: NORMAL

## 2021-04-18 LAB — BASIC METABOLIC PANEL
Anion gap: 5 (ref 5–15)
BUN: 43 mg/dL — ABNORMAL HIGH (ref 6–20)
CO2: 29 mmol/L (ref 22–32)
Calcium: 9.2 mg/dL (ref 8.9–10.3)
Chloride: 100 mmol/L (ref 98–111)
Creatinine, Ser: 1.52 mg/dL — ABNORMAL HIGH (ref 0.44–1.00)
GFR, Estimated: 39 mL/min — ABNORMAL LOW (ref >60)
Glucose, Bld: 123 mg/dL — ABNORMAL HIGH (ref 70–99)
Potassium: 5 mmol/L (ref 3.5–5.1)
Sodium: 134 mmol/L — ABNORMAL LOW (ref 135–145)

## 2021-04-18 MED ORDER — ORAL CARE MOUTH RINSE
15.0000 mL | Freq: Two times a day (BID) | OROMUCOSAL | Status: DC
  Administered 2021-04-18 – 2021-04-19 (×3): 15 mL via OROMUCOSAL

## 2021-04-18 MED ORDER — ORAL CARE MOUTH RINSE: 15.0000 mL | Freq: Two times a day (BID) | OROMUCOSAL | Status: DC

## 2021-04-18 MED ORDER — PREDNISONE 20 MG PO TABS
40.0000 mg | ORAL_TABLET | Freq: Every day | ORAL | Status: DC
  Administered 2021-04-19: 40 mg via ORAL
  Filled 2021-04-18: qty 2

## 2021-04-18 NOTE — Progress Notes (Signed)
[facility] ? ?PROGRESS NOTE ? ?Fabian Sharp CBU:[id number] DOB: Sep 24, 1960 DOA: 04/14/2021 ?PCP: Greer Ee., FNP ? ? ?Brief HPI:   ?61 year old female with a history of COPD with ongoing tobacco abuse, sarcoidosis, fibromyalgia, hypertension came to ED with complaints of increasing shortness of breath ongoing for past 1 week.  In the ED she was found to be hypoxemic requiring 2 L of oxygen.  Chest x-ray did not show anything acute.  COVID test was negative.  Patient admitted for acute hypoxemic respiratory failure ? ?Subjective  ? ?Patient seen and examined, breathing is significantly improved. ? ? Assessment/Plan:  ? ? ?Acute hypoxemic respiratory failure ?-Secondary to COPD exacerbation ?--Improved on Solu-Medrol, Pulmicort nebulizer ?-Ambulatory pulse ox showed O2 sats 91% on room air ?-Continue Zithromax ?-We will discontinue IV Solu-Medrol and start prednisone 40 mg p.o. daily from tomorrow morning ? ?Acute kidney injury ?-Mild, creatinine 1.52, BUN 43 ?-Dose of HCTZ has been changed to 12.5 mg p.o. daily ?-Started on gentle IV hydration with normal saline 75 mill per hour for 12 hours ?-Follow BMP in am ? ? ?Hypertension ?-Blood pressure is mildly elevated ?-Continue HCTZ 12.5 mg daily  ?-irbesartan on hold due to hyperkalemia ?-metoprolol 25 mg p.o. twice daily ?-Continue hydralazine as needed ? ?Hyperkalemia ?-Resolved, potassium is 5.0 ? ?History of fibromyalgia ?-Continue Cymbalta, gabapentin, hydrocodone ? ?History of myositis ?-Followed by hematologist ? ?History of chronic pancreatitis ?-Continue Creon ? ?Migraine ?-Started on Fioricet 1 tablet every 6 hours as needed ? ? ? ?Medications ? ?  ? atorvastatin  20 mg Oral QHS  ? budesonide (PULMICORT) nebulizer solution  0.25 mg Nebulization BID  ? DULoxetine  60 mg Oral Daily  ? enoxaparin (LOVENOX) injection  40 mg Subcutaneous Q24H  ? famotidine  20 mg Oral BID  ? gabapentin  300 mg Oral BID  ? hydrochlorothiazide  12.5 mg Oral Daily   ? ipratropium-albuterol  3 mL Nebulization Q6H  ? lipase/protease/amylase  24,000 Units Oral TID AC  ? loratadine  10 mg Oral Daily  ? mouth rinse  15 mL Mouth Rinse BID  ? melatonin  10 mg Oral QHS  ? metoprolol tartrate  25 mg Oral BID  ? montelukast  10 mg Oral QHS  ? nicotine  14 mg Transdermal Daily  ? polyethylene glycol  17 g Oral Daily  ? [START ON 04/19/2021] predniSONE  40 mg Oral Q breakfast  ? ? ? Data Reviewed:  ? ?CBG: ? ?Recent Labs  ?Lab 04/16/21 ?2052 04/17/21 ?0732  ?GLUCAP 104* 107*  ? ? ?SpO2: 93 % ?O2 Flow Rate (L/min): 2 L/min ?FiO2 (%): 28 %  ? ? ?Vitals:  ? 04/18/21 0401 04/18/21 0739 04/18/21 0841 04/18/21 1011  ?BP: (!) 141/89   (!) 166/100  ?Pulse: 62   65  ?Resp: 18   19  ?Temp: 98.5 ?F (36.9 ?C)   (!) 97.3 ?F (36.3 ?C)  ?TempSrc: Oral   Oral  ?SpO2: 96% 96% 96% 93%  ?Weight:      ?Height:      ? ? ? ? ?Data Reviewed: ? ?Basic Metabolic Panel: ?Recent Labs  ?Lab 04/14/21 ?1933 04/15/21 ?0321 04/16/21 ?0411 04/17/21 ?2248 04/18/21 ?0355  ?NA 139 136 132* 131* 134*  ?K 3.1* 4.0 5.1 5.8* 5.0  ?CL 103 102 98 99 100  ?CO2 27 26 26 27 29   ?GLUCOSE 139* 164* 126* 127* 123*  ?BUN 11 18 33* 39* 43*  ?CREATININE 0.91 1.38* 0.98 1.20* 1.52*  ?CALCIUM  9.3 9.5 9.7 9.4 9.2  ?MG  --  2.0  --   --   --   ? ? ?CBC: ?Recent Labs  ?Lab 04/14/21 ?1933 04/15/21 ?0346 04/16/21 ?0411  ?WBC 11.4* 8.2 12.4*  ?HGB 12.7 12.1 12.4  ?HCT 37.0 35.9* 36.6  ?MCV 96.9 99.2 99.2  ?PLT 262 243 248  ? ? ?LFT ?No results for input(s): AST, ALT, ALKPHOS, BILITOT, PROT, ALBUMIN in the last 168 hours. ?  ?Antibiotics: ?Anti-infectives (From admission, onward)  ? ? Start     Dose/Rate Route Frequency Ordered Stop  ? 04/15/21 0400  azithromycin (ZITHROMAX) 500 mg in sodium chloride 0.9 % 250 mL IVPB       ? 500 mg ?250 mL/hr over 60 Minutes Intravenous Every 24 hours 04/15/21 0328    ? ?  ? ? ? ?DVT prophylaxis: Lovenox  ? ?Code Status: Full code ? ?Family Communication: No family at bedside ? ? ?CONSULTS  ? ? ?Objective   ? ? ?Physical Examination: ? ?General-appears in no acute distress ?Heart-S1-S2, regular, no murmur auscultated ?Lungs-clear to auscultation bilaterally, no wheezing or crackles auscultated ?Abdomen-soft, nontender, no organomegaly ?Extremities-no edema in the lower extremities ?Neuro-alert, oriented x3, no focal deficit noted ? ? ?Status is: Inpatient: COPD exacerbation ? ? ? ?  ? ?Meredeth Ide ?  ?[facility] ?If 7PM-7AM, please contact night-coverage at [email or web address], ?Office  [phone] ? ? ?04/18/2021, 2:32 PM  LOS: 3 days  ? ? ? ? ? ? ? ? ? ? ?  ?

## 2021-04-18 NOTE — Progress Notes (Signed)
SATURATION QUALIFICATIONS: (This note is used to comply with regulatory documentation for home oxygen) ? ?Patient Saturations on Room Air at Rest = 98% ? ?Patient Saturations on Room Air while Ambulating = 91% ? ?Patient Saturations on 1 Liters of oxygen while Ambulating = 94% ? ?Please briefly explain why patient needs home oxygen: Pt states she frequently feels SOB with her asthma and COPD. This admission she was found to be hypoxic with respiratory failure. ?

## 2021-04-19 LAB — BASIC METABOLIC PANEL
Anion gap: 6 (ref 5–15)
BUN: 33 mg/dL — ABNORMAL HIGH (ref 6–20)
CO2: 30 mmol/L (ref 22–32)
Calcium: 9.3 mg/dL (ref 8.9–10.3)
Chloride: 100 mmol/L (ref 98–111)
Creatinine, Ser: 1.36 mg/dL — ABNORMAL HIGH (ref 0.44–1.00)
GFR, Estimated: 45 mL/min — ABNORMAL LOW (ref >60)
Glucose, Bld: 89 mg/dL (ref 70–99)
Potassium: 4.3 mmol/L (ref 3.5–5.1)
Sodium: 136 mmol/L (ref 135–145)

## 2021-04-19 MED ORDER — PREDNISONE 10 MG PO TABS: ORAL_TABLET | ORAL | 0 refills | Status: DC

## 2021-04-19 MED ORDER — POLYETHYLENE GLYCOL 3350 17 G PO PACK: 17.0000 g | PACK | Freq: Every day | ORAL | 0 refills | Status: AC | PRN

## 2021-04-19 MED ORDER — BUTALBITAL-APAP-CAFFEINE 50-325-40 MG PO TABS: 1.0000 | ORAL_TABLET | Freq: Four times a day (QID) | ORAL | 0 refills | Status: AC | PRN

## 2021-04-19 MED ORDER — SENNOSIDES-DOCUSATE SODIUM 8.6-50 MG PO TABS: 1.0000 | ORAL_TABLET | Freq: Every evening | ORAL | 0 refills | Status: AC | PRN

## 2021-04-19 MED ORDER — HYDROCHLOROTHIAZIDE 12.5 MG PO TABS
12.5000 mg | ORAL_TABLET | Freq: Every day | ORAL | 3 refills | Status: AC
Start: 2021-04-20 — End: ?

## 2021-04-19 MED ORDER — METOPROLOL TARTRATE 25 MG PO TABS: 25.0000 mg | ORAL_TABLET | Freq: Two times a day (BID) | ORAL | 2 refills | Status: AC

## 2021-04-19 NOTE — Discharge Summary (Signed)
?Physician Discharge Summary ?  ?Patient: Dawn Moses MRN: [id number] DOB: [birth year]  ?Admit date:     04/14/2021  ?Discharge date: 04/19/21  ?Discharge Physician: Meredeth Ide  ? ?PCP: Greer Ee., FNP  ? ?Recommendations at discharge:  ? ?Follow-up PCP in 1 week ?Consider referral to nephrology as outpatient ? ?Discharge Diagnoses: ?Principal Problem: ?  COPD exacerbation (HCC) ?Active Problems: ?  Essential hypertension ? ?Resolved Problems: ?  * No resolved hospital problems. * ? ?Hospital Course: ?61 year old female with a history of COPD with ongoing tobacco abuse, sarcoidosis, fibromyalgia, hypertension came to ED with complaints of increasing shortness of breath ongoing for past 1 week.  In the ED she was found to be hypoxemic requiring 2 L of oxygen.  Chest x-ray did not show anything acute.  COVID test was negative.  Patient admitted for acute hypoxemic respiratory failure ? ?Assessment and Plan: ? ?Acute hypoxemic respiratory failure ?-Secondary to COPD exacerbation ?--Improved on Solu-Medrol, Pulmicort nebulizer ?-Ambulatory pulse ox showed O2 sats 91% on room air ?-Completed Zithromax for 5 days  ?-We will discharge on prednisone taper for 5 more days ? ?  ?Acute kidney injury ?-Mild, creatinine 1.52, BUN 43 ?-Dose of HCTZ has been changed to 12.5 mg p.o. daily ?-Will benefit from nephrology follow-up as outpatient ?  ?Hypertension ?-Blood pressure is mildly elevated ?-Continue HCTZ 12.5 mg daily  ?-irbesartan on hold due to hyperkalemia ?-metoprolol 25 mg p.o. twice daily ?-We will discharge on metoprolol 25 mg p.o. twice daily, HCTZ 12.5 mg daily ?  ?Hyperkalemia ?-Resolved ?  ?History of fibromyalgia ?-Continue Cymbalta, gabapentin, hydrocodone ?  ?History of myositis ?-Followed by hematologist ?  ?History of chronic pancreatitis ?-Continue Creon ?  ?Migraine ?-Started on Fioricet 1 tablet every 6 hours as needed ? ?  ? ? ?Consultants:  ?Procedures performed: ? ? ?Disposition: Home ?Diet  recommendation:  ?Discharge Diet Orders (From admission, onward)  ? ?  Start     Ordered  ? 04/19/21 0000  Diet - low sodium heart healthy       ? 04/19/21 1044  ? ?  ?  ? ?  ? ?Regular diet ?DISCHARGE MEDICATION: ?Allergies as of 04/19/2021   ? ?   Reactions  ? Chocolate Itching  ? Penicillins Hives  ? Azithromycin Nausea And Vomiting, Other (See Comments)  ? Severe abdominal cramps  ? Cephalexin Itching, Rash  ?   ? Cyclobenzaprine   ? And "knocks me out"- "puts me to seep and I can't wake up"  ? Lisinopril Cough  ? Other reaction(s): Cough (ALLERGY/intolerance)  ? ?  ? ?  ?Medication List  ?  ? ?STOP taking these medications   ? ?acetaminophen 500 MG tablet ?Commonly known as: TYLENOL ?  ?doxycycline 100 MG capsule ?Commonly known as: VIBRAMYCIN ?  ?valsartan-hydrochlorothiazide 160-25 MG tablet ?Commonly known as: DIOVAN-HCT ?  ? ?  ? ?TAKE these medications   ? ?Advair Diskus 500-50 MCG/ACT Aepb ?Generic drug: fluticasone-salmeterol ?Inhale 2 puffs into the lungs daily. ?  ?albuterol 108 (90 Base) MCG/ACT inhaler ?Commonly known as: VENTOLIN HFA ?Inhale into the lungs every 6 (six) hours as needed for wheezing or shortness of breath. ?  ?albuterol (2.5 MG/3ML) 0.083% nebulizer solution ?Commonly known as: PROVENTIL ?Take 3 mLs (2.5 mg total) by nebulization every 6 (six) hours as needed for wheezing or shortness of breath. ?  ?ASHWAGANDHA PO ?Take 1 tablet by mouth daily. ?  ?atorvastatin 20 MG tablet ?Commonly known as: LIPITOR ?Take  20 mg by mouth daily at 6 PM. ?  ?butalbital-acetaminophen-caffeine 50-325-40 MG tablet ?Commonly known as: FIORICET ?Take 1 tablet by mouth every 6 (six) hours as needed for headache or migraine. ?  ?carboxymethylcellulose 0.5 % Soln ?Commonly known as: REFRESH PLUS ?1 drop 3 (three) times daily as needed (dry eyes). ?  ?cetirizine 10 MG tablet ?Commonly known as: ZYRTEC ?Take 10 mg by mouth daily. ?  ?Creon 24000-76000 units Cpep ?Generic drug: Pancrelipase (Lip-Prot-Amyl) ?Take 1  capsule by mouth 3 (three) times daily. ?  ?DULoxetine 60 MG capsule ?Commonly known as: CYMBALTA ?Take 60 mg by mouth at bedtime. ?  ?ELDERBERRY PO ?Take 2 tablets by mouth daily. ?  ?famotidine 20 MG tablet ?Commonly known as: PEPCID ?Take 20 mg by mouth 2 (two) times daily. ?  ?fluticasone 50 MCG/ACT nasal spray ?Commonly known as: FLONASE ?Place 1 spray into both nostrils daily. ?  ?gabapentin 300 MG capsule ?Commonly known as: NEURONTIN ?Take 300 mg by mouth 2 (two) times daily. ?  ?hydrochlorothiazide 12.5 MG tablet ?Commonly known as: HYDRODIURIL ?Take 1 tablet (12.5 mg total) by mouth daily. ?Start taking on: April 20, 2021 ?  ?HYDROcodone-acetaminophen 5-325 MG tablet ?Commonly known as: NORCO/VICODIN ?Take 1 tablet by mouth See admin instructions. Takes 1 tablet every night at bedtime. Takes 1 tablet every 6 hours as needed for severe pain. ?  ?hydroxychloroquine 200 MG tablet ?Commonly known as: PLAQUENIL ?Take 200 mg by mouth daily. ?  ?metoprolol tartrate 25 MG tablet ?Commonly known as: LOPRESSOR ?Take 1 tablet (25 mg total) by mouth 2 (two) times daily. ?  ?montelukast 10 MG tablet ?Commonly known as: SINGULAIR ?Take 10 mg by mouth at bedtime. ?  ?multivitamin with minerals Tabs tablet ?Take 1 tablet by mouth daily. ?  ?nicotine 7 mg/24hr patch ?Commonly known as: NICODERM CQ - dosed in mg/24 hr ?Place 7 mg onto the skin daily. ?  ?ondansetron 8 MG disintegrating tablet ?Commonly known as: Zofran ODT ?8mg  ODT q8 hours prn nausea ?What changed:  ?how much to take ?how to take this ?when to take this ?reasons to take this ?additional instructions ?  ?polyethylene glycol 17 g packet ?Commonly known as: MIRALAX / GLYCOLAX ?Take 17 g by mouth daily as needed. ?  ?predniSONE 10 MG tablet ?Commonly known as: DELTASONE ?Prednisone 40 mg po daily x 1 day then Prednisone 30 mg po daily x 1 day then Prednisone 20 mg po daily x 1 day then Prednisone 10 mg daily x 1 day then stop... ?  ?senna-docusate 8.6-50 MG  tablet ?Commonly known as: Senokot-S ?Take 1 tablet by mouth at bedtime as needed for mild constipation. ?  ?Spiriva HandiHaler 18 MCG inhalation capsule ?Generic drug: tiotropium ?Place 1 capsule into inhaler and inhale daily. ?  ?tiZANidine 4 MG tablet ?Commonly known as: ZANAFLEX ?Take 4 mg by mouth at bedtime. ?  ?traMADol 50 MG tablet ?Commonly known as: ULTRAM ?Take 50 mg by mouth every 8 (eight) hours as needed for moderate pain. ?  ?VITAMIN B12 PO ?Take 1 tablet by mouth daily. ?  ?vitamin C 500 MG tablet ?Commonly known as: ASCORBIC ACID ?Take 500 mg by mouth daily. ?  ? ?  ? ? ?Discharge Exam: ?Filed Weights  ? 04/14/21 1939 04/15/21 0015  ?Weight: 59.9 kg 58.6 kg  ? ?General-appears in no acute distress ?Heart-S1-S2, regular, no murmur auscultated ?Lungs-clear to auscultation bilaterally, no wheezing or crackles auscultated ?Abdomen-soft, nontender, no organomegaly ?Extremities-no edema in the lower extremities ?Neuro-alert, oriented  x3, no focal deficit noted ? ?Condition at discharge: good ? ?The results of significant diagnostics from this hospitalization (including imaging, microbiology, ancillary and laboratory) are listed below for reference.  ? ?Imaging Studies: ?DG Chest Port 1 View ? ?Result Date: 04/14/2021 ?CLINICAL DATA:  Shortness of breath.  Sarcoidosis. EXAM: PORTABLE CHEST 1 VIEW COMPARISON:  Chest radiograph dated 11/27/2020 and CT dated 03/19/2021. FINDINGS: Diffuse chronic interstitial coarsening and bilateral upper lobe reticular scarring in keeping with sarcoidosis. Scattered small nodular densities better seen on the prior CT. No lobar consolidation, pleural effusion, or pneumothorax. The cardiac silhouette is within normal limits. No acute osseous pathology. IMPRESSION: No acute cardiopulmonary process. Electronically Signed   By: Elgie CollardArash  Radparvar M.D.   On: 04/14/2021 20:06   ? ?Microbiology: ?Results for orders placed or performed during the hospital encounter of 04/14/21  ?Resp  Panel by RT-PCR (Flu A&B, Covid) Nasopharyngeal Swab     Status: None  ? Collection Time: 04/14/21  7:59 PM  ? Specimen: Nasopharyngeal Swab; Nasopharyngeal(NP) swabs in vial transport medium  ?Result Val

## 2021-04-19 NOTE — Plan of Care (Signed)
?  Problem: Education: ?Goal: Knowledge of General Education information will improve ?Description: Including pain rating scale, medication(s)/side effects and non-pharmacologic comfort measures ?Outcome: Adequate for Discharge ?  ?Problem: Education: ?Goal: Knowledge of disease or condition will improve ?Outcome: Adequate for Discharge ?Goal: Knowledge of the prescribed therapeutic regimen will improve ?Outcome: Adequate for Discharge ?Goal: Individualized Educational Video(s) ?Outcome: Adequate for Discharge ?  ?Problem: Activity: ?Goal: Ability to tolerate increased activity will improve ?Outcome: Adequate for Discharge ?Goal: Will verbalize the importance of balancing activity with adequate rest periods ?Outcome: Adequate for Discharge ?  ?Problem: Respiratory: ?Goal: Ability to maintain a clear airway will improve ?Outcome: Adequate for Discharge ?Goal: Levels of oxygenation will improve ?Outcome: Adequate for Discharge ?Goal: Ability to maintain adequate ventilation will improve ?Outcome: Adequate for Discharge ?  ?

## 2021-04-19 NOTE — Progress Notes (Signed)
AVS reviewed with patient. All questions by patient have been answered. Medications have been discussed. Patient will ring when her ride is here. ?

## 2021-07-23 ENCOUNTER — Other Ambulatory Visit: Payer: Self-pay

## 2021-07-23 ENCOUNTER — Inpatient Hospital Stay (HOSPITAL_BASED_OUTPATIENT_CLINIC_OR_DEPARTMENT_OTHER)
Admission: EM | Admit: 2021-07-23 | Discharge: 2021-07-29 | DRG: 193 | Disposition: A | Discharge status: Home / Self Care | Payer: Medicaid Other | Attending: Internal Medicine | Admitting: Internal Medicine

## 2021-07-23 ENCOUNTER — Emergency Department (HOSPITAL_BASED_OUTPATIENT_CLINIC_OR_DEPARTMENT_OTHER): Payer: Medicaid Other

## 2021-07-23 ENCOUNTER — Encounter (HOSPITAL_BASED_OUTPATIENT_CLINIC_OR_DEPARTMENT_OTHER): Payer: Self-pay | Admitting: Emergency Medicine

## 2021-07-23 DIAGNOSIS — J189 Pneumonia, unspecified organism: Principal | ICD-10-CM | POA: Diagnosis present

## 2021-07-23 DIAGNOSIS — E785 Hyperlipidemia, unspecified: Secondary | ICD-10-CM | POA: Diagnosis present

## 2021-07-23 DIAGNOSIS — Z79899 Other long term (current) drug therapy: Secondary | ICD-10-CM

## 2021-07-23 DIAGNOSIS — G8929 Other chronic pain: Secondary | ICD-10-CM | POA: Diagnosis present

## 2021-07-23 DIAGNOSIS — R1012 Left upper quadrant pain: Secondary | ICD-10-CM

## 2021-07-23 DIAGNOSIS — D539 Nutritional anemia, unspecified: Secondary | ICD-10-CM | POA: Diagnosis present

## 2021-07-23 DIAGNOSIS — Z20822 Contact with and (suspected) exposure to covid-19: Secondary | ICD-10-CM | POA: Diagnosis present

## 2021-07-23 DIAGNOSIS — J441 Chronic obstructive pulmonary disease with (acute) exacerbation: Secondary | ICD-10-CM | POA: Diagnosis present

## 2021-07-23 DIAGNOSIS — Z7951 Long term (current) use of inhaled steroids: Secondary | ICD-10-CM

## 2021-07-23 DIAGNOSIS — N1831 Chronic kidney disease, stage 3a: Secondary | ICD-10-CM | POA: Diagnosis present

## 2021-07-23 DIAGNOSIS — J188 Other pneumonia, unspecified organism: Secondary | ICD-10-CM | POA: Diagnosis present

## 2021-07-23 DIAGNOSIS — J9601 Acute respiratory failure with hypoxia: Secondary | ICD-10-CM | POA: Diagnosis present

## 2021-07-23 DIAGNOSIS — H1032 Unspecified acute conjunctivitis, left eye: Secondary | ICD-10-CM | POA: Diagnosis present

## 2021-07-23 DIAGNOSIS — Z720 Tobacco use: Secondary | ICD-10-CM | POA: Diagnosis present

## 2021-07-23 DIAGNOSIS — K861 Other chronic pancreatitis: Secondary | ICD-10-CM | POA: Diagnosis present

## 2021-07-23 DIAGNOSIS — H109 Unspecified conjunctivitis: Secondary | ICD-10-CM | POA: Diagnosis present

## 2021-07-23 DIAGNOSIS — D869 Sarcoidosis, unspecified: Secondary | ICD-10-CM | POA: Diagnosis present

## 2021-07-23 DIAGNOSIS — E876 Hypokalemia: Secondary | ICD-10-CM | POA: Diagnosis present

## 2021-07-23 DIAGNOSIS — M797 Fibromyalgia: Secondary | ICD-10-CM | POA: Diagnosis present

## 2021-07-23 DIAGNOSIS — F17210 Nicotine dependence, cigarettes, uncomplicated: Secondary | ICD-10-CM | POA: Diagnosis present

## 2021-07-23 DIAGNOSIS — I10 Essential (primary) hypertension: Secondary | ICD-10-CM | POA: Diagnosis present

## 2021-07-23 DIAGNOSIS — I129 Hypertensive chronic kidney disease with stage 1 through stage 4 chronic kidney disease, or unspecified chronic kidney disease: Secondary | ICD-10-CM | POA: Diagnosis present

## 2021-07-23 DIAGNOSIS — Z9049 Acquired absence of other specified parts of digestive tract: Secondary | ICD-10-CM

## 2021-07-23 DIAGNOSIS — J439 Emphysema, unspecified: Secondary | ICD-10-CM | POA: Diagnosis present

## 2021-07-23 LAB — URINALYSIS, ROUTINE W REFLEX MICROSCOPIC
Glucose, UA: NEGATIVE mg/dL
Hgb urine dipstick: NEGATIVE
Ketones, ur: NEGATIVE mg/dL
Nitrite: NEGATIVE
Protein, ur: 100 mg/dL — AB
Specific Gravity, Urine: 1.02 (ref 1.005–1.030)
pH: 6 (ref 5.0–8.0)

## 2021-07-23 LAB — COMPREHENSIVE METABOLIC PANEL
ALT: 18 U/L (ref 0–44)
AST: 21 U/L (ref 15–41)
Albumin: 3.6 g/dL (ref 3.5–5.0)
Alkaline Phosphatase: 74 U/L (ref 38–126)
Anion gap: 12 (ref 5–15)
BUN: 9 mg/dL (ref 6–20)
CO2: 25 mmol/L (ref 22–32)
Calcium: 9.1 mg/dL (ref 8.9–10.3)
Chloride: 98 mmol/L (ref 98–111)
Creatinine, Ser: 1.14 mg/dL — ABNORMAL HIGH (ref 0.44–1.00)
GFR, Estimated: 55 mL/min — ABNORMAL LOW (ref >60)
Glucose, Bld: 94 mg/dL (ref 70–99)
Potassium: 3.4 mmol/L — ABNORMAL LOW (ref 3.5–5.1)
Sodium: 135 mmol/L (ref 135–145)
Total Bilirubin: 0.7 mg/dL (ref 0.3–1.2)
Total Protein: 7.7 g/dL (ref 6.5–8.1)

## 2021-07-23 LAB — CBC WITH DIFFERENTIAL/PLATELET
Abs Immature Granulocytes: 0.03 10*3/uL (ref 0.00–0.07)
Basophils Absolute: 0 10*3/uL (ref 0.0–0.1)
Basophils Relative: 0 %
Eosinophils Absolute: 0.2 10*3/uL (ref 0.0–0.5)
Eosinophils Relative: 2 %
HCT: 34.3 % — ABNORMAL LOW (ref 36.0–46.0)
Hemoglobin: 12 g/dL (ref 12.0–15.0)
Immature Granulocytes: 0 %
Lymphocytes Relative: 12 %
Lymphs Abs: 1 10*3/uL (ref 0.7–4.0)
MCH: 34.5 pg — ABNORMAL HIGH (ref 26.0–34.0)
MCHC: 35 g/dL (ref 30.0–36.0)
MCV: 98.6 fL (ref 80.0–100.0)
Monocytes Absolute: 1.1 10*3/uL — ABNORMAL HIGH (ref 0.1–1.0)
Monocytes Relative: 13 %
Neutro Abs: 6.1 10*3/uL (ref 1.7–7.7)
Neutrophils Relative %: 73 %
Platelets: 248 10*3/uL (ref 150–400)
RBC: 3.48 MIL/uL — ABNORMAL LOW (ref 3.87–5.11)
RDW: 12.6 % (ref 11.5–15.5)
WBC: 8.4 10*3/uL (ref 4.0–10.5)
nRBC: 0 % (ref 0.0–0.2)

## 2021-07-23 LAB — URINALYSIS, MICROSCOPIC (REFLEX): RBC / HPF: NONE SEEN RBC/hpf (ref 0–5)

## 2021-07-23 LAB — PREGNANCY, URINE: Preg Test, Ur: NEGATIVE

## 2021-07-23 LAB — LIPASE, BLOOD: Lipase: 33 U/L (ref 11–51)

## 2021-07-23 MED ORDER — ONDANSETRON HCL 4 MG/2ML IJ SOLN
4.0000 mg | Freq: Once | INTRAMUSCULAR | Status: AC
  Administered 2021-07-23: 4 mg via INTRAVENOUS
  Filled 2021-07-23: qty 2

## 2021-07-23 MED ORDER — SODIUM CHLORIDE 0.9 % IV BOLUS
1000.0000 mL | Freq: Once | INTRAVENOUS | Status: AC
  Administered 2021-07-23: 1000 mL via INTRAVENOUS

## 2021-07-23 MED ORDER — POLYMYXIN B-TRIMETHOPRIM 10000-0.1 UNIT/ML-% OP SOLN
1.0000 [drp] | Freq: Once | OPHTHALMIC | Status: AC
  Administered 2021-07-24: 1 [drp] via OPHTHALMIC
  Filled 2021-07-23: qty 10

## 2021-07-23 MED ORDER — IOHEXOL 300 MG/ML  SOLN
100.0000 mL | Freq: Once | INTRAMUSCULAR | Status: AC | PRN
  Administered 2021-07-23: 100 mL via INTRAVENOUS

## 2021-07-23 MED ORDER — HYDROMORPHONE HCL 1 MG/ML IJ SOLN
0.5000 mg | Freq: Once | INTRAMUSCULAR | Status: AC
  Administered 2021-07-23: 0.5 mg via INTRAVENOUS
  Filled 2021-07-23: qty 1

## 2021-07-23 NOTE — ED Provider Notes (Signed)
[facility] EMERGENCY DEPARTMENT Provider Note   CSN: [id number] Arrival date & time: 07/23/21  1752     History  Chief Complaint  Patient presents with   Abdominal Pain   Eye Drainage    Dawn Moses is a 61 y.o. female.  Patient with cholecystectomy approximately 1 year ago, sarcoidosis -- presents for evaluation of abdominal pain and vomiting.  She states that she has had intermittent episodes of abdominal pain with occasional diarrhea type stools over the past year since her surgery.  Typically her pain is right-sided.  Today she awoke with left-sided abdominal pain which is different, no significant bowel movement.  She has had multiple episodes of vomiting.  No chest pain or shortness of breath.  Symptoms uncontrolled at home.  She reports having history of pancreatitis but states that this feels different.  Also noted left eye drainage starting this morning and cough for 2 days.  She states that she has gastroenterology follow-up in the morning.       Home Medications Prior to Admission medications   Medication Sig Start Date End Date Taking? Authorizing Provider  ADVAIR DISKUS 500-50 MCG/ACT AEPB Inhale 2 puffs into the lungs daily. 03/24/21   [provider]  albuterol (PROVENTIL HFA;VENTOLIN HFA) 108 (90 BASE) MCG/ACT inhaler Inhale into the lungs every 6 (six) hours as needed for wheezing or shortness of breath.    [provider]  albuterol (PROVENTIL) (2.5 MG/3ML) 0.083% nebulizer solution Take 3 mLs (2.5 mg total) by nebulization every 6 (six) hours as needed for wheezing or shortness of breath. 11/27/20   Jacalyn Lefevre, MD  ASHWAGANDHA PO Take 1 tablet by mouth daily.    [provider]  atorvastatin (LIPITOR) 20 MG tablet Take 20 mg by mouth daily at 6 PM. 01/26/21   [provider]  butalbital-acetaminophen-caffeine (FIORICET) 50-325-40 MG tablet Take 1 tablet by mouth every 6 (six) hours as needed for headache or migraine.  04/19/21   Meredeth Ide, MD  carboxymethylcellulose (REFRESH PLUS) 0.5 % SOLN 1 drop 3 (three) times daily as needed (dry eyes).    [provider]  cetirizine (ZYRTEC) 10 MG tablet Take 10 mg by mouth daily. 03/05/21   [provider]  CREON 24000-76000 units CPEP Take 1 capsule by mouth 3 (three) times daily. 03/19/21   [provider]  Cyanocobalamin (VITAMIN B12 PO) Take 1 tablet by mouth daily.    [provider]  DULoxetine (CYMBALTA) 60 MG capsule Take 60 mg by mouth at bedtime. 03/11/21   [provider]  ELDERBERRY PO Take 2 tablets by mouth daily.    [provider]  famotidine (PEPCID) 20 MG tablet Take 20 mg by mouth 2 (two) times daily. 12/03/20   [provider]  fluticasone (FLONASE) 50 MCG/ACT nasal spray Place 1 spray into both nostrils daily.    [provider]  gabapentin (NEURONTIN) 300 MG capsule Take 300 mg by mouth 2 (two) times daily. 03/11/21   [provider]  hydrochlorothiazide (HYDRODIURIL) 12.5 MG tablet Take 1 tablet (12.5 mg total) by mouth daily. 04/20/21   Meredeth Ide, MD  HYDROcodone-acetaminophen (NORCO/VICODIN) 5-325 MG tablet Take 1 tablet by mouth See admin instructions. Takes 1 tablet every night at bedtime. Takes 1 tablet every 6 hours as needed for severe pain. 03/25/21   [provider]  hydroxychloroquine (PLAQUENIL) 200 MG tablet Take 200 mg by mouth daily. 12/03/20   [provider]  metoprolol tartrate (LOPRESSOR) 25  MG tablet Take 1 tablet (25 mg total) by mouth 2 (two) times daily. 04/19/21   Meredeth Ide, MD  montelukast (SINGULAIR) 10 MG tablet Take 10 mg by mouth at bedtime. 12/09/20   [provider]  Multiple Vitamin (MULTIVITAMIN WITH MINERALS) TABS tablet Take 1 tablet by mouth daily.    [provider]  nicotine (NICODERM CQ - DOSED IN MG/24 HR) 7 mg/24hr patch Place 7 mg onto the skin daily. 03/09/21   [provider]   ondansetron (ZOFRAN ODT) 8 MG disintegrating tablet 8mg  ODT q8 hours prn nausea Patient taking differently: Take 8 mg by mouth every 8 (eight) hours as needed for nausea or vomiting. 10/13/13   Palumbo, April, MD  polyethylene glycol (MIRALAX / GLYCOLAX) 17 g packet Take 17 g by mouth daily as needed. 04/19/21   06/19/21, MD  predniSONE (DELTASONE) 10 MG tablet Prednisone 40 mg po daily x 1 day then Prednisone 30 mg po daily x 1 day then Prednisone 20 mg po daily x 1 day then Prednisone 10 mg daily x 1 day then stop... 04/19/21   06/19/21, MD  senna-docusate (SENOKOT-S) 8.6-50 MG tablet Take 1 tablet by mouth at bedtime as needed for mild constipation. 04/19/21   06/19/21, MD  SPIRIVA HANDIHALER 18 MCG inhalation capsule Place 1 capsule into inhaler and inhale daily. 02/17/21   [provider]  tiZANidine (ZANAFLEX) 4 MG tablet Take 4 mg by mouth at bedtime. 12/03/20   [provider]  traMADol (ULTRAM) 50 MG tablet Take 50 mg by mouth every 8 (eight) hours as needed for moderate pain. 03/25/21   [provider]  vitamin C (ASCORBIC ACID) 500 MG tablet Take 500 mg by mouth daily.    [provider]      Allergies    Penicillins, Azithromycin, Cephalexin, Cyclobenzaprine, and Lisinopril    Review of Systems   Review of Systems  Physical Exam Updated Vital Signs BP (!) 179/95   Pulse (!) 107   Temp 99.2 F (37.3 C) (Oral)   Resp 18   SpO2 91%  Physical Exam Vitals and nursing note reviewed.  Constitutional:      General: She is in acute distress.     Appearance: She is well-developed.     Comments: Patient appears uncomfortable.  HENT:     Head: Normocephalic and atraumatic.     Right Ear: External ear normal.     Left Ear: External ear normal.     Nose: Nose normal.  Eyes:     Conjunctiva/sclera:     Right eye: Right conjunctiva is not injected. No chemosis or exudate.    Left eye: Left conjunctiva is injected (Mild). Exudate (Mild)  present. No chemosis. Cardiovascular:     Rate and Rhythm: Regular rhythm. Tachycardia present.     Heart sounds: No murmur heard. Pulmonary:     Effort: No respiratory distress.     Breath sounds: No wheezing, rhonchi or rales.  Abdominal:     Palpations: Abdomen is soft.     Tenderness: There is generalized abdominal tenderness. There is no guarding or rebound.  Musculoskeletal:     Cervical back: Normal range of motion and neck supple.     Right lower leg: No edema.     Left lower leg: No edema.  Skin:    General: Skin is warm and dry.     Findings: No rash.  Neurological:     General: No  focal deficit present.     Mental Status: She is alert. Mental status is at baseline.     Motor: No weakness.  Psychiatric:        Mood and Affect: Mood normal.     ED Results / Procedures / Treatments   Labs (all labs ordered are listed, but only abnormal results are displayed) Labs Reviewed  COMPREHENSIVE METABOLIC PANEL - Abnormal; Notable for the following components:      Result Value   Potassium 3.4 (*)    Creatinine, Ser 1.14 (*)    GFR, Estimated 55 (*)    All other components within normal limits  URINALYSIS, ROUTINE W REFLEX MICROSCOPIC - Abnormal; Notable for the following components:   Color, Urine AMBER (*)    APPearance HAZY (*)    Bilirubin Urine SMALL (*)    Protein, ur 100 (*)    Leukocytes,Ua TRACE (*)    All other components within normal limits  CBC WITH DIFFERENTIAL/PLATELET - Abnormal; Notable for the following components:   RBC 3.48 (*)    HCT 34.3 (*)    MCH 34.5 (*)    Monocytes Absolute 1.1 (*)    All other components within normal limits  URINALYSIS, MICROSCOPIC (REFLEX) - Abnormal; Notable for the following components:   Bacteria, UA FEW (*)    All other components within normal limits  LIPASE, BLOOD  PREGNANCY, URINE    EKG None  Radiology No results found.  Procedures Procedures    Medications Ordered in ED Medications  sodium  chloride 0.9 % bolus 1,000 mL (has no administration in time range)  HYDROmorphone (DILAUDID) injection 0.5 mg (has no administration in time range)  ondansetron (ZOFRAN) injection 4 mg (has no administration in time range)    ED Course/ Medical Decision Making/ A&P    Patient seen and examined. History obtained directly from patient. Work-up including labs, imaging, EKG ordered in triage, if performed, were reviewed.    Labs/EKG: Independently reviewed and interpreted.  This included: CBC was normal white blood cell count, normal hemoglobin; CMP with minimally elevated creatinine at 1.14, minimally low potassium at 3.4 otherwise unremarkable; lipase 33 without signs of pancreatitis; UA not suggestive of infection.  Imaging: Ordered CT abdomen and pelvis.  Medications/Fluids: Ordered: IV Dilaudid, IV Zofran, fluid bolus  Most recent vital signs reviewed and are as follows: BP (!) 179/95   Pulse (!) 107   Temp 99.2 F (37.3 C) (Oral)   Resp 18   SpO2 91%   Initial impression: Abdominal pain.  We will need to entertain the possibility of bowel obstruction given the patient's history of surgery.  She does appear to have a mild left eye conjunctivitis as well.  She agrees to proceed with imaging.  12:16 AM Reassessment performed. Patient appears uncomfortable.  Continuing to have pain that is worse over the left lower and lateral ribs.  Patient is holding this area.  Pain is worse with deep breathing.  Imaging personally visualized and interpreted including: CT without acute findings.  Patient does have left basilar infiltrate, possibly related to ongoing symptoms, however symptoms are more severe than I would typically expect for pneumonia.  She will be given additional medication for pain control.  Temperature rechecked by myself was 98.5 F.  Reviewed pertinent lab work and imaging with patient at bedside. Questions answered.   Most current vital signs reviewed and are as  follows: BP (!) 179/95   Pulse (!) 107   Temp 99.2 F (37.3 C) (  Oral)   Resp 18   SpO2 91%   Plan: She will be given additional pain medication.  Discussed with Dr. Adela Lank who will assume care.  Entertained possibility of PE causing a pulmonary infarction.  Will check D-dimer, EKG, trop.  Patient will need at least community-acquired pneumonia coverage pending remainder of work-up and symptom control.                           Medical Decision Making Amount and/or Complexity of Data Reviewed Labs: ordered. Radiology: ordered.  Risk Prescription drug management.   Patient with chest pain and upper abdominal pain.  Cough and conjunctivitis over the past 2 days.  For this patient's complaint of abdominal pain, the following conditions were considered on the differential diagnosis: gastritis/PUD, enteritis/duodenitis, appendicitis, cholelithiasis/cholecystitis, cholangitis, pancreatitis, ruptured viscus, colitis, diverticulitis, small/large bowel obstruction, proctitis, cystitis, pyelonephritis, ureteral colic, aortic dissection, aortic aneurysm. In women, ectopic pregnancy, pelvic inflammatory disease, ovarian cysts, and tubo-ovarian abscess were also considered. Atypical chest etiologies were also considered including ACS, PE, and pneumonia.          Final Clinical Impression(s) / ED Diagnoses Final diagnoses:  Community acquired pneumonia of left lower lobe of lung  Acute conjunctivitis of left eye, unspecified acute conjunctivitis type  Left upper quadrant abdominal pain    Rx / DC Orders ED Discharge Orders     None         Renne Crigler, PA-C 07/24/21 0019    Melene Plan, DO 07/24/21 0159

## 2021-07-23 NOTE — ED Triage Notes (Addendum)
Pt c/o LUQ abdominal pain and left eye drainage since this morning. Also endorses n/v for a few days. Hx of pancreatitis. Pt/family requesting to be checked for H Pylori.

## 2021-07-24 ENCOUNTER — Emergency Department (HOSPITAL_BASED_OUTPATIENT_CLINIC_OR_DEPARTMENT_OTHER): Payer: Medicaid Other

## 2021-07-24 DIAGNOSIS — D869 Sarcoidosis, unspecified: Secondary | ICD-10-CM | POA: Diagnosis present

## 2021-07-24 DIAGNOSIS — F17210 Nicotine dependence, cigarettes, uncomplicated: Secondary | ICD-10-CM | POA: Diagnosis present

## 2021-07-24 DIAGNOSIS — I129 Hypertensive chronic kidney disease with stage 1 through stage 4 chronic kidney disease, or unspecified chronic kidney disease: Secondary | ICD-10-CM | POA: Diagnosis present

## 2021-07-24 DIAGNOSIS — H1032 Unspecified acute conjunctivitis, left eye: Secondary | ICD-10-CM | POA: Diagnosis present

## 2021-07-24 DIAGNOSIS — E785 Hyperlipidemia, unspecified: Secondary | ICD-10-CM | POA: Diagnosis present

## 2021-07-24 DIAGNOSIS — D539 Nutritional anemia, unspecified: Secondary | ICD-10-CM | POA: Diagnosis present

## 2021-07-24 DIAGNOSIS — J441 Chronic obstructive pulmonary disease with (acute) exacerbation: Secondary | ICD-10-CM | POA: Diagnosis not present

## 2021-07-24 DIAGNOSIS — M797 Fibromyalgia: Secondary | ICD-10-CM | POA: Diagnosis present

## 2021-07-24 DIAGNOSIS — K861 Other chronic pancreatitis: Secondary | ICD-10-CM | POA: Diagnosis present

## 2021-07-24 DIAGNOSIS — J439 Emphysema, unspecified: Secondary | ICD-10-CM | POA: Diagnosis present

## 2021-07-24 DIAGNOSIS — Z79899 Other long term (current) drug therapy: Secondary | ICD-10-CM | POA: Diagnosis not present

## 2021-07-24 DIAGNOSIS — E876 Hypokalemia: Secondary | ICD-10-CM | POA: Diagnosis present

## 2021-07-24 DIAGNOSIS — Z7951 Long term (current) use of inhaled steroids: Secondary | ICD-10-CM | POA: Diagnosis not present

## 2021-07-24 DIAGNOSIS — J188 Other pneumonia, unspecified organism: Secondary | ICD-10-CM | POA: Diagnosis present

## 2021-07-24 DIAGNOSIS — Z9049 Acquired absence of other specified parts of digestive tract: Secondary | ICD-10-CM | POA: Diagnosis not present

## 2021-07-24 DIAGNOSIS — N1831 Chronic kidney disease, stage 3a: Secondary | ICD-10-CM | POA: Diagnosis present

## 2021-07-24 DIAGNOSIS — J9601 Acute respiratory failure with hypoxia: Secondary | ICD-10-CM | POA: Diagnosis present

## 2021-07-24 DIAGNOSIS — J189 Pneumonia, unspecified organism: Secondary | ICD-10-CM | POA: Diagnosis present

## 2021-07-24 DIAGNOSIS — G8929 Other chronic pain: Secondary | ICD-10-CM | POA: Diagnosis present

## 2021-07-24 DIAGNOSIS — H109 Unspecified conjunctivitis: Secondary | ICD-10-CM | POA: Diagnosis present

## 2021-07-24 DIAGNOSIS — Z20822 Contact with and (suspected) exposure to covid-19: Secondary | ICD-10-CM | POA: Diagnosis present

## 2021-07-24 DIAGNOSIS — Z720 Tobacco use: Secondary | ICD-10-CM | POA: Diagnosis present

## 2021-07-24 LAB — CBC WITH DIFFERENTIAL/PLATELET
Abs Immature Granulocytes: 0.03 10*3/uL (ref 0.00–0.07)
Basophils Absolute: 0 10*3/uL (ref 0.0–0.1)
Basophils Relative: 0 %
Eosinophils Absolute: 0.2 10*3/uL (ref 0.0–0.5)
Eosinophils Relative: 2 %
HCT: 33.7 % — ABNORMAL LOW (ref 36.0–46.0)
Hemoglobin: 11.4 g/dL — ABNORMAL LOW (ref 12.0–15.0)
Immature Granulocytes: 0 %
Lymphocytes Relative: 10 %
Lymphs Abs: 1 10*3/uL (ref 0.7–4.0)
MCH: 34.1 pg — ABNORMAL HIGH (ref 26.0–34.0)
MCHC: 33.8 g/dL (ref 30.0–36.0)
MCV: 100.9 fL — ABNORMAL HIGH (ref 80.0–100.0)
Monocytes Absolute: 1.5 10*3/uL — ABNORMAL HIGH (ref 0.1–1.0)
Monocytes Relative: 15 %
Neutro Abs: 7.4 10*3/uL (ref 1.7–7.7)
Neutrophils Relative %: 73 %
Platelets: 235 10*3/uL (ref 150–400)
RBC: 3.34 MIL/uL — ABNORMAL LOW (ref 3.87–5.11)
RDW: 12.6 % (ref 11.5–15.5)
WBC: 10 10*3/uL (ref 4.0–10.5)
nRBC: 0 % (ref 0.0–0.2)

## 2021-07-24 LAB — COMPREHENSIVE METABOLIC PANEL
ALT: 17 U/L (ref 0–44)
AST: 18 U/L (ref 15–41)
Albumin: 3.4 g/dL — ABNORMAL LOW (ref 3.5–5.0)
Alkaline Phosphatase: 71 U/L (ref 38–126)
Anion gap: 10 (ref 5–15)
BUN: 7 mg/dL (ref 6–20)
CO2: 23 mmol/L (ref 22–32)
Calcium: 9.1 mg/dL (ref 8.9–10.3)
Chloride: 103 mmol/L (ref 98–111)
Creatinine, Ser: 0.8 mg/dL (ref 0.44–1.00)
GFR, Estimated: >60 mL/min (ref >60)
Glucose, Bld: 103 mg/dL — ABNORMAL HIGH (ref 70–99)
Potassium: 3.8 mmol/L (ref 3.5–5.1)
Sodium: 136 mmol/L (ref 135–145)
Total Bilirubin: 1.1 mg/dL (ref 0.3–1.2)
Total Protein: 7.4 g/dL (ref 6.5–8.1)

## 2021-07-24 LAB — D-DIMER, QUANTITATIVE: D-Dimer, Quant: 0.84 ug/mL-FEU — ABNORMAL HIGH (ref 0.00–0.50)

## 2021-07-24 MED ORDER — METOPROLOL TARTRATE 5 MG/5ML IV SOLN
5.0000 mg | Freq: Four times a day (QID) | INTRAVENOUS | Status: DC | PRN
  Administered 2021-07-24 – 2021-07-27 (×2): 5 mg via INTRAVENOUS
  Filled 2021-07-24 (×2): qty 5

## 2021-07-24 MED ORDER — MONTELUKAST SODIUM 10 MG PO TABS
10.0000 mg | ORAL_TABLET | Freq: Every day | ORAL | Status: DC
  Administered 2021-07-24 – 2021-07-28 (×5): 10 mg via ORAL
  Filled 2021-07-24 (×5): qty 1

## 2021-07-24 MED ORDER — ACETAMINOPHEN 325 MG PO TABS
650.0000 mg | ORAL_TABLET | Freq: Four times a day (QID) | ORAL | Status: DC | PRN
  Administered 2021-07-24 – 2021-07-27 (×3): 650 mg via ORAL
  Filled 2021-07-24 (×3): qty 2

## 2021-07-24 MED ORDER — LORATADINE 10 MG PO TABS
10.0000 mg | ORAL_TABLET | Freq: Every day | ORAL | Status: DC
  Administered 2021-07-24 – 2021-07-29 (×6): 10 mg via ORAL
  Filled 2021-07-24 (×6): qty 1

## 2021-07-24 MED ORDER — HYDROMORPHONE HCL 1 MG/ML IJ SOLN
1.0000 mg | Freq: Once | INTRAMUSCULAR | Status: AC
  Administered 2021-07-24: 1 mg via INTRAVENOUS
  Filled 2021-07-24: qty 1

## 2021-07-24 MED ORDER — DULOXETINE HCL 30 MG PO CPEP
60.0000 mg | ORAL_CAPSULE | Freq: Every day | ORAL | Status: DC
  Administered 2021-07-24 – 2021-07-28 (×5): 60 mg via ORAL
  Filled 2021-07-24 (×5): qty 2

## 2021-07-24 MED ORDER — HYDROMORPHONE HCL 1 MG/ML IJ SOLN
0.5000 mg | Freq: Once | INTRAMUSCULAR | Status: AC
  Administered 2021-07-24: 0.5 mg via INTRAVENOUS
  Filled 2021-07-24: qty 1

## 2021-07-24 MED ORDER — IPRATROPIUM-ALBUTEROL 0.5-2.5 (3) MG/3ML IN SOLN
3.0000 mL | Freq: Four times a day (QID) | RESPIRATORY_TRACT | Status: DC
  Administered 2021-07-24 – 2021-07-25 (×2): 3 mL via RESPIRATORY_TRACT
  Filled 2021-07-24 (×3): qty 3

## 2021-07-24 MED ORDER — OXYCODONE-ACETAMINOPHEN 5-325 MG PO TABS
1.0000 | ORAL_TABLET | Freq: Four times a day (QID) | ORAL | Status: DC | PRN
  Administered 2021-07-24 – 2021-07-28 (×9): 1 via ORAL
  Filled 2021-07-24 (×9): qty 1

## 2021-07-24 MED ORDER — METOPROLOL TARTRATE 25 MG PO TABS
25.0000 mg | ORAL_TABLET | Freq: Two times a day (BID) | ORAL | Status: DC
  Administered 2021-07-24 – 2021-07-29 (×10): 25 mg via ORAL
  Filled 2021-07-24 (×10): qty 1

## 2021-07-24 MED ORDER — IPRATROPIUM-ALBUTEROL 0.5-2.5 (3) MG/3ML IN SOLN: 3.0000 mL | Freq: Four times a day (QID) | RESPIRATORY_TRACT | Status: DC | PRN

## 2021-07-24 MED ORDER — SODIUM CHLORIDE 0.9 % IV BOLUS
1000.0000 mL | Freq: Once | INTRAVENOUS | Status: AC
  Administered 2021-07-24: 1000 mL via INTRAVENOUS

## 2021-07-24 MED ORDER — GUAIFENESIN ER 600 MG PO TB12
600.0000 mg | ORAL_TABLET | Freq: Two times a day (BID) | ORAL | Status: DC
  Administered 2021-07-24 – 2021-07-29 (×10): 600 mg via ORAL
  Filled 2021-07-24 (×10): qty 1

## 2021-07-24 MED ORDER — MELATONIN 5 MG PO TABS
5.0000 mg | ORAL_TABLET | Freq: Every evening | ORAL | Status: DC | PRN
  Administered 2021-07-24: 5 mg via ORAL
  Filled 2021-07-24: qty 1

## 2021-07-24 MED ORDER — TIZANIDINE HCL 4 MG PO TABS
4.0000 mg | ORAL_TABLET | Freq: Every day | ORAL | Status: DC
  Administered 2021-07-24 – 2021-07-28 (×5): 4 mg via ORAL
  Filled 2021-07-24 (×5): qty 1

## 2021-07-24 MED ORDER — HYDROCHLOROTHIAZIDE 12.5 MG PO TABS
12.5000 mg | ORAL_TABLET | Freq: Every day | ORAL | Status: DC
  Administered 2021-07-24 – 2021-07-29 (×6): 12.5 mg via ORAL
  Filled 2021-07-24 (×6): qty 1

## 2021-07-24 MED ORDER — LEVOFLOXACIN IN D5W 500 MG/100ML IV SOLN
500.0000 mg | INTRAVENOUS | Status: DC
Start: 2021-07-25 — End: 2021-07-27
  Administered 2021-07-25 – 2021-07-27 (×3): 500 mg via INTRAVENOUS
  Filled 2021-07-24 (×3): qty 100

## 2021-07-24 MED ORDER — FLUTICASONE PROPIONATE 50 MCG/ACT NA SUSP
1.0000 | Freq: Every day | NASAL | Status: DC | PRN
Start: 2021-07-24 — End: 2021-07-29
  Administered 2021-07-25: 1 via NASAL
  Filled 2021-07-24 (×2): qty 16

## 2021-07-24 MED ORDER — ENOXAPARIN SODIUM 40 MG/0.4ML IJ SOSY
40.0000 mg | PREFILLED_SYRINGE | INTRAMUSCULAR | Status: DC
  Administered 2021-07-24 – 2021-07-28 (×5): 40 mg via SUBCUTANEOUS
  Filled 2021-07-24 (×5): qty 0.4

## 2021-07-24 MED ORDER — ALBUTEROL SULFATE (2.5 MG/3ML) 0.083% IN NEBU: 2.5000 mg | INHALATION_SOLUTION | RESPIRATORY_TRACT | Status: DC | PRN

## 2021-07-24 MED ORDER — LEVOFLOXACIN IN D5W 750 MG/150ML IV SOLN
750.0000 mg | Freq: Once | INTRAVENOUS | Status: AC
Start: 2021-07-24 — End: 2021-07-24
  Administered 2021-07-24: 750 mg via INTRAVENOUS
  Filled 2021-07-24: qty 150

## 2021-07-24 MED ORDER — POLYMYXIN B-TRIMETHOPRIM 10000-0.1 UNIT/ML-% OP SOLN
1.0000 [drp] | Freq: Every day | OPHTHALMIC | Status: DC | PRN
  Administered 2021-07-24: 1 [drp] via OPHTHALMIC
  Filled 2021-07-24: qty 10

## 2021-07-24 MED ORDER — POLYETHYLENE GLYCOL 3350 17 G PO PACK
17.0000 g | PACK | Freq: Every day | ORAL | Status: DC | PRN
Start: 2021-07-24 — End: 2021-07-29
  Administered 2021-07-25 – 2021-07-26 (×2): 17 g via ORAL
  Filled 2021-07-24 (×2): qty 1

## 2021-07-24 MED ORDER — ADULT MULTIVITAMIN W/MINERALS CH
1.0000 | ORAL_TABLET | Freq: Every day | ORAL | Status: DC
  Administered 2021-07-24 – 2021-07-29 (×6): 1 via ORAL
  Filled 2021-07-24 (×6): qty 1

## 2021-07-24 MED ORDER — NICOTINE 14 MG/24HR TD PT24
14.0000 mg | MEDICATED_PATCH | Freq: Every day | TRANSDERMAL | Status: DC
  Administered 2021-07-24 – 2021-07-29 (×6): 14 mg via TRANSDERMAL
  Filled 2021-07-24 (×6): qty 1

## 2021-07-24 MED ORDER — ONDANSETRON 4 MG PO TBDP
8.0000 mg | ORAL_TABLET | Freq: Three times a day (TID) | ORAL | Status: DC | PRN
Start: 2021-07-24 — End: 2021-07-29

## 2021-07-24 MED ORDER — SENNOSIDES-DOCUSATE SODIUM 8.6-50 MG PO TABS: 1.0000 | ORAL_TABLET | Freq: Every evening | ORAL | Status: DC | PRN

## 2021-07-24 MED ORDER — GABAPENTIN 300 MG PO CAPS
300.0000 mg | ORAL_CAPSULE | Freq: Two times a day (BID) | ORAL | Status: DC
  Administered 2021-07-24 – 2021-07-29 (×10): 300 mg via ORAL
  Filled 2021-07-24 (×10): qty 1

## 2021-07-24 MED ORDER — PANCRELIPASE (LIP-PROT-AMYL) 12000-38000 UNITS PO CPEP
24000.0000 [IU] | ORAL_CAPSULE | Freq: Three times a day (TID) | ORAL | Status: DC
  Administered 2021-07-24 – 2021-07-29 (×13): 24000 [IU] via ORAL
  Filled 2021-07-24 (×13): qty 2

## 2021-07-24 MED ORDER — ACETAMINOPHEN 650 MG RE SUPP: 650.0000 mg | Freq: Four times a day (QID) | RECTAL | Status: DC | PRN

## 2021-07-24 MED ORDER — HYDROXYCHLOROQUINE SULFATE 200 MG PO TABS
200.0000 mg | ORAL_TABLET | Freq: Every day | ORAL | Status: DC
  Administered 2021-07-25 – 2021-07-29 (×5): 200 mg via ORAL
  Filled 2021-07-24 (×5): qty 1

## 2021-07-24 MED ORDER — ONDANSETRON HCL 4 MG/2ML IJ SOLN
4.0000 mg | Freq: Once | INTRAMUSCULAR | Status: AC
Start: 2021-07-24 — End: 2021-07-24
  Administered 2021-07-24: 4 mg via INTRAVENOUS
  Filled 2021-07-24: qty 2

## 2021-07-24 MED ORDER — GUAIFENESIN-DM 100-10 MG/5ML PO SYRP
5.0000 mL | ORAL_SOLUTION | ORAL | Status: DC | PRN
  Administered 2021-07-24: 5 mL via ORAL
  Filled 2021-07-24: qty 5

## 2021-07-24 MED ORDER — IOHEXOL 350 MG/ML SOLN
75.0000 mL | Freq: Once | INTRAVENOUS | Status: AC | PRN
  Administered 2021-07-24: 75 mL via INTRAVENOUS

## 2021-07-24 MED ORDER — OXYCODONE HCL 5 MG PO TABS
5.0000 mg | ORAL_TABLET | Freq: Once | ORAL | Status: AC
  Administered 2021-07-24: 5 mg via ORAL
  Filled 2021-07-24: qty 1

## 2021-07-24 MED ORDER — POTASSIUM CHLORIDE CRYS ER 20 MEQ PO TBCR
40.0000 meq | EXTENDED_RELEASE_TABLET | Freq: Every day | ORAL | Status: DC
  Administered 2021-07-24 – 2021-07-29 (×6): 40 meq via ORAL
  Filled 2021-07-24 (×7): qty 2

## 2021-07-24 MED ORDER — ATORVASTATIN CALCIUM 10 MG PO TABS
20.0000 mg | ORAL_TABLET | Freq: Every evening | ORAL | Status: DC
  Administered 2021-07-24 – 2021-07-28 (×5): 20 mg via ORAL
  Filled 2021-07-24 (×5): qty 2

## 2021-07-24 MED ORDER — ASCORBIC ACID 500 MG PO TABS
500.0000 mg | ORAL_TABLET | Freq: Every day | ORAL | Status: DC
  Administered 2021-07-24 – 2021-07-29 (×6): 500 mg via ORAL
  Filled 2021-07-24 (×6): qty 1

## 2021-07-24 MED ORDER — OXYCODONE HCL 5 MG PO TABS
5.0000 mg | ORAL_TABLET | Freq: Four times a day (QID) | ORAL | Status: DC | PRN
  Administered 2021-07-24 – 2021-07-27 (×7): 5 mg via ORAL
  Filled 2021-07-24 (×7): qty 1

## 2021-07-24 NOTE — ED Notes (Signed)
[facility] in to transport to [facility]

## 2021-07-24 NOTE — Plan of Care (Signed)
[facility] will assume care on arrival to accepting facility. Until arrival, care as per EDP. However, [facility] available 24/7 for questions and assistance.  Nursing staff, please page [facility] Admits and Consults ([phone]) as soon as the patient arrives to the hospital.   

## 2021-07-24 NOTE — H&P (Signed)
History and Physical    PatientShayleigh Moses [id number] DOB: 06-25-1960 DOA: 07/23/2021 DOS: the patient was seen and examined on 07/24/2021 PCP: Haywood Filler., FNP  Patient coming from: Home  Chief Complaint:  Chief Complaint  Patient presents with   Abdominal Pain   Eye Drainage   HPI: Dawn Moses is a 61 y.o. female with medical history significant of fibromyalgia, COPD, sarcoidosis, tobacco abuse, chronic pain, HTN, chronic pancreatitis. Presenting with LUQ ab pain and cough. She woke yesterday with left sided abdominal pain. She thought it might have been her pancreatitis, but typically that pain is on the right for her. She tried her home pain meds but they didn't help. She noticed that she was coughing so much that it was making her vomit. As the day went on, she felt more and more short of breath. She didn't have any fever or sick contacts. When her symptoms didn't improve yesterday evening, she decided to come to the ED for evaluation. She denies any other aggravating or alleviating factors.   Review of Systems: As mentioned in the history of present illness. All other systems reviewed and are negative. Past Medical History:  Diagnosis Date   Asthma    COPD (chronic obstructive pulmonary disease) ([place])    Hypertension    Sarcoidosis    Past Surgical History:  Procedure Laterality Date   FOOT FUSION     HERNIA REPAIR     THROAT SURGERY     TONSILLECTOMY     Social History:  reports that she has been smoking cigarettes. She has been smoking an average of .5 packs per day. She does not have any smokeless tobacco history on file. She reports that she does not drink alcohol and does not use drugs.  Allergies  Allergen Reactions   Penicillins Hives   Azithromycin Nausea And Vomiting and Other (See Comments)    Severe abdominal cramps    Cephalexin Itching and Rash        Cyclobenzaprine     And "knocks me out"- "puts me to seep and I can't wake up"     Lisinopril Cough    Other reaction(s): Cough (ALLERGY/intolerance)     Family History  Family history unknown: Yes    Prior to Admission medications   Medication Sig Start Date End Date Taking? Authorizing Provider  ADVAIR DISKUS 500-50 MCG/ACT AEPB Inhale 2 puffs into the lungs daily. 03/24/21   [provider]  albuterol (PROVENTIL HFA;VENTOLIN HFA) 108 (90 BASE) MCG/ACT inhaler Inhale into the lungs every 6 (six) hours as needed for wheezing or shortness of breath.    [provider]  albuterol (PROVENTIL) (2.5 MG/3ML) 0.083% nebulizer solution Take 3 mLs (2.5 mg total) by nebulization every 6 (six) hours as needed for wheezing or shortness of breath. 11/27/20   Isla Pence, MD  ASHWAGANDHA PO Take 1 tablet by mouth daily.    [provider]  atorvastatin (LIPITOR) 20 MG tablet Take 20 mg by mouth daily at 6 PM. 01/26/21   [provider]  butalbital-acetaminophen-caffeine (FIORICET) 50-325-40 MG tablet Take 1 tablet by mouth every 6 (six) hours as needed for headache or migraine. 04/19/21   Oswald Hillock, MD  carboxymethylcellulose (REFRESH PLUS) 0.5 % SOLN 1 drop 3 (three) times daily as needed (dry eyes).    [provider]  cetirizine (ZYRTEC) 10 MG tablet Take 10 mg by mouth daily. 03/05/21   [provider]  CREON 24000-76000 units CPEP Take 1  capsule by mouth 3 (three) times daily. 03/19/21   [provider]  Cyanocobalamin (VITAMIN B12 PO) Take 1 tablet by mouth daily.    [provider]  DULoxetine (CYMBALTA) 60 MG capsule Take 60 mg by mouth at bedtime. 03/11/21   [provider]  ELDERBERRY PO Take 2 tablets by mouth daily.    [provider]  famotidine (PEPCID) 20 MG tablet Take 20 mg by mouth 2 (two) times daily. 12/03/20   [provider]  fluticasone (FLONASE) 50 MCG/ACT nasal spray Place 1 spray into both nostrils daily.    [provider]  gabapentin (NEURONTIN) 300 MG  capsule Take 300 mg by mouth 2 (two) times daily. 03/11/21   [provider]  hydrochlorothiazide (HYDRODIURIL) 12.5 MG tablet Take 1 tablet (12.5 mg total) by mouth daily. 04/20/21   Meredeth Ide, MD  HYDROcodone-acetaminophen (NORCO/VICODIN) 5-325 MG tablet Take 1 tablet by mouth See admin instructions. Takes 1 tablet every night at bedtime. Takes 1 tablet every 6 hours as needed for severe pain. 03/25/21   [provider]  hydroxychloroquine (PLAQUENIL) 200 MG tablet Take 200 mg by mouth daily. 12/03/20   [provider]  metoprolol tartrate (LOPRESSOR) 25 MG tablet Take 1 tablet (25 mg total) by mouth 2 (two) times daily. 04/19/21   Meredeth Ide, MD  montelukast (SINGULAIR) 10 MG tablet Take 10 mg by mouth at bedtime. 12/09/20   [provider]  Multiple Vitamin (MULTIVITAMIN WITH MINERALS) TABS tablet Take 1 tablet by mouth daily.    [provider]  nicotine (NICODERM CQ - DOSED IN MG/24 HR) 7 mg/24hr patch Place 7 mg onto the skin daily. 03/09/21   [provider]  ondansetron (ZOFRAN ODT) 8 MG disintegrating tablet 8mg  ODT q8 hours prn nausea Patient taking differently: Take 8 mg by mouth every 8 (eight) hours as needed for nausea or vomiting. 10/13/13   Palumbo, April, MD  polyethylene glycol (MIRALAX / GLYCOLAX) 17 g packet Take 17 g by mouth daily as needed. 04/19/21   06/19/21, MD  predniSONE (DELTASONE) 10 MG tablet Prednisone 40 mg po daily x 1 day then Prednisone 30 mg po daily x 1 day then Prednisone 20 mg po daily x 1 day then Prednisone 10 mg daily x 1 day then stop... 04/19/21   06/19/21, MD  senna-docusate (SENOKOT-S) 8.6-50 MG tablet Take 1 tablet by mouth at bedtime as needed for mild constipation. 04/19/21   06/19/21, MD  SPIRIVA HANDIHALER 18 MCG inhalation capsule Place 1 capsule into inhaler and inhale daily. 02/17/21   [provider]  tiZANidine (ZANAFLEX) 4 MG tablet Take 4 mg by mouth at bedtime. 12/03/20    [provider]  traMADol (ULTRAM) 50 MG tablet Take 50 mg by mouth every 8 (eight) hours as needed for moderate pain. 03/25/21   [provider]  vitamin C (ASCORBIC ACID) 500 MG tablet Take 500 mg by mouth daily.    [provider]    Physical Exam: Vitals:   07/24/21 1100 07/24/21 1200 07/24/21 1351 07/24/21 1458  BP: (!) 162/91 (!) 143/94  (!) 195/101  Pulse: 91 (!) 102  88  Resp: (!) 23 (!) 24  (!) 26  Temp:   98.2 F (36.8 C) 98.4 F (36.9 C)  TempSrc:   Oral Oral  SpO2: 97% 99%  100%   General: 61 y.o. female resting in bed in NAD Eyes: PERRL, normal sclera ENMT: Nares  patent w/o discharge, orophaynx clear, dentition normal, ears w/o discharge/lesions/ulcers Neck: Supple, trachea midline Cardiovascular: RRR, +S1, S2, no m/g/r, equal pulses throughout Respiratory: diffuse expiratory wheeze, rhonchi at bases GI: BS+, NDNT, no masses noted, no organomegaly noted MSK: No e/c/c Neuro: A&O x 3, no focal deficits Psyc: Appropriate interaction and affect, calm/cooperative  Data Reviewed:  Na+  135 K+  3.4 CO2  98 BUN  9 Scr  1.14 Lipase 33 WBC  8.4 Hgb  12.0 Plt  248 D-dimer 0.84  CT ab/pelvis: Mild left lower lobe infiltrate. No acute abnormality is noted within the abdomen or pelvis.  CTA PE: 1. No evidence of pulmonary embolism. 2. Consolidation in the left lower lobe and new patchy opacities in the right middle lobe and posterior segment of the left upper lobe, may be infectious or inflammatory. 3. Extensive fibrotic changes in the lungs with an upper lobe predominance and likely related to patient's history of sarcoidosis and not significantly changed from the prior exam. 4. Aortic atherosclerosis. 5. Emphysema.  Assessment and Plan: Multifocal PNA COPD exacerbation     - placed in obs, tele     - continue lvq (allergies to zithro and ceph/PCN)     - schedule duonebs for right now     - guaifenesin     - wean O2 as  able  HTN     - resume home regimen  Hypokalemia     - replace K+, check Mg2+  Tobacco abuse     - counsel against further use     - nicotine patch  Chronic pain     - PRNs ordered  Chronic pancreatitis     - continue home regimen  Sarcoidosis     - continue home regimen  CKD 3a     - she is at baseline, watch nephrotoxins  Conjunctivitis?     - Report of conjunctivitis in ED; was given 1 dose of polytrim this morning at 0028hrs; no meds since, her eye appear normal on exam     - will have PRN drops  HLD     - continue home regimen  Advance Care Planning:   Code Status: FULL  Consults: None  Family Communication: None at bedside  Severity of Illness: The appropriate patient status for this patient is INPATIENT. Inpatient status is judged to be reasonable and necessary in order to provide the required intensity of service to ensure the patient's safety. The patient's presenting symptoms, physical exam findings, and initial radiographic and laboratory data in the context of their chronic comorbidities is felt to place them at high risk for further clinical deterioration. Furthermore, it is not anticipated that the patient will be medically stable for discharge from the hospital within 2 midnights of admission.   * I certify that at the point of admission it is my clinical judgment that the patient will require inpatient hospital care spanning beyond 2 midnights from the point of admission due to high intensity of service, high risk for further deterioration and high frequency of surveillance required.*  Author: Teddy Spike, DO 07/24/2021 3:09 PM  For on call review [email or web address].

## 2021-07-24 NOTE — ED Notes (Signed)
Medicated for left ant chest discomfort, states that her fibromyalgia is acting up

## 2021-07-24 NOTE — ED Notes (Signed)
Phone Handoff Report provided to rec RN at [facility]/ [address] °

## 2021-07-24 NOTE — ED Notes (Signed)
In for nurse rounding, pt request assistance to restroom, is able to ambulate with min assistance, gait is steady, client becomes very dyspneic with exertion, noted to performing forced exhalation. Tachycardic upon return to room with HR at 112/min, POX on RA at 90%. Client returned to bed, placed in [place], [place] reapplied at 2lpm, POX increased to 100%, RR returned to 20/min, HR to 98/min.

## 2021-07-24 NOTE — ED Notes (Signed)
[facility] enroute to [facility]. Report given

## 2021-07-24 NOTE — Progress Notes (Signed)
   07/24/21 1458  Assess: MEWS Score  Temp 98.4 F (36.9 C)  BP (!) 195/101  MAP (mmHg) 128  Pulse Rate 88  Resp (!) 26  SpO2 100 %  Assess: MEWS Score  MEWS Temp 0  MEWS Systolic 0  MEWS Pulse 0  MEWS RR 2  MEWS LOC 0  MEWS Score 2  MEWS Score Color Yellow  Assess: if the MEWS score is Yellow or Red  Were vital signs taken at a resting state? Yes  Focused Assessment No change from prior assessment  Does the patient meet 2 or more of the SIRS criteria? No  MEWS guidelines implemented *See Row Information* No, previously yellow, continue vital signs every 4 hours  Treat  MEWS Interventions Administered prn meds/treatments  Pain Scale 0-10  Pain Score 0  Take Vital Signs  Increase Vital Sign Frequency  Yellow: Q 2hr X 2 then Q 4hr X 2, if remains yellow, continue Q 4hrs  Escalate  MEWS: Escalate Yellow: discuss with charge nurse/RN and consider discussing with provider and RRT  Notify: Charge Nurse/RN  Name of Charge Nurse/RN Notified Christena Deem RN  Date Charge Nurse/RN Notified 07/24/21  Time Charge Nurse/RN Notified 1639  Notify: Provider  Provider Name/Title Ronaldo Miyamoto  Date Provider Notified 07/24/21  Time Provider Notified 1500  Method of Notification Page  Notification Reason Other (Comment) (Patient arrived to unit)  Provider response See new orders  Date of Provider Response 07/24/21  Time of Provider Response 1640  Notify: Rapid Response  Name of Rapid Response RN Notified  (none)  Document  Patient Outcome Stabilized after interventions  Progress note created (see row info) Yes  Assess: SIRS CRITERIA  SIRS Temperature  0  SIRS Pulse 0  SIRS Respirations  1  SIRS WBC 0  SIRS Score Sum  1

## 2021-07-25 DIAGNOSIS — E7800 Pure hypercholesterolemia, unspecified: Secondary | ICD-10-CM

## 2021-07-25 DIAGNOSIS — H1032 Unspecified acute conjunctivitis, left eye: Secondary | ICD-10-CM | POA: Diagnosis not present

## 2021-07-25 DIAGNOSIS — I10 Essential (primary) hypertension: Secondary | ICD-10-CM

## 2021-07-25 DIAGNOSIS — K861 Other chronic pancreatitis: Secondary | ICD-10-CM

## 2021-07-25 DIAGNOSIS — J441 Chronic obstructive pulmonary disease with (acute) exacerbation: Secondary | ICD-10-CM | POA: Diagnosis not present

## 2021-07-25 DIAGNOSIS — E876 Hypokalemia: Secondary | ICD-10-CM

## 2021-07-25 DIAGNOSIS — Z720 Tobacco use: Secondary | ICD-10-CM

## 2021-07-25 DIAGNOSIS — N1831 Chronic kidney disease, stage 3a: Secondary | ICD-10-CM

## 2021-07-25 DIAGNOSIS — D869 Sarcoidosis, unspecified: Secondary | ICD-10-CM

## 2021-07-25 DIAGNOSIS — J189 Pneumonia, unspecified organism: Secondary | ICD-10-CM | POA: Diagnosis not present

## 2021-07-25 LAB — CBC WITH DIFFERENTIAL/PLATELET
Abs Immature Granulocytes: 0.02 10*3/uL (ref 0.00–0.07)
Basophils Absolute: 0 10*3/uL (ref 0.0–0.1)
Basophils Relative: 0 %
Eosinophils Absolute: 0.2 10*3/uL (ref 0.0–0.5)
Eosinophils Relative: 2 %
HCT: 31.6 % — ABNORMAL LOW (ref 36.0–46.0)
Hemoglobin: 10.5 g/dL — ABNORMAL LOW (ref 12.0–15.0)
Immature Granulocytes: 0 %
Lymphocytes Relative: 12 %
Lymphs Abs: 1 10*3/uL (ref 0.7–4.0)
MCH: 34.1 pg — ABNORMAL HIGH (ref 26.0–34.0)
MCHC: 33.2 g/dL (ref 30.0–36.0)
MCV: 102.6 fL — ABNORMAL HIGH (ref 80.0–100.0)
Monocytes Absolute: 1.3 10*3/uL — ABNORMAL HIGH (ref 0.1–1.0)
Monocytes Relative: 17 %
Neutro Abs: 5.4 10*3/uL (ref 1.7–7.7)
Neutrophils Relative %: 69 %
Platelets: 245 10*3/uL (ref 150–400)
RBC: 3.08 MIL/uL — ABNORMAL LOW (ref 3.87–5.11)
RDW: 12.7 % (ref 11.5–15.5)
WBC: 7.8 10*3/uL (ref 4.0–10.5)
nRBC: 0 % (ref 0.0–0.2)

## 2021-07-25 LAB — RESPIRATORY PANEL BY PCR

## 2021-07-25 LAB — COMPREHENSIVE METABOLIC PANEL
ALT: 18 U/L (ref 0–44)
AST: 24 U/L (ref 15–41)
Albumin: 3.1 g/dL — ABNORMAL LOW (ref 3.5–5.0)
Alkaline Phosphatase: 75 U/L (ref 38–126)
Anion gap: 11 (ref 5–15)
BUN: 10 mg/dL (ref 6–20)
CO2: 21 mmol/L — ABNORMAL LOW (ref 22–32)
Calcium: 8.9 mg/dL (ref 8.9–10.3)
Chloride: 103 mmol/L (ref 98–111)
Creatinine, Ser: 1.12 mg/dL — ABNORMAL HIGH (ref 0.44–1.00)
GFR, Estimated: 57 mL/min — ABNORMAL LOW (ref >60)
Glucose, Bld: 108 mg/dL — ABNORMAL HIGH (ref 70–99)
Potassium: 3.8 mmol/L (ref 3.5–5.1)
Sodium: 135 mmol/L (ref 135–145)
Total Bilirubin: 0.7 mg/dL (ref 0.3–1.2)
Total Protein: 6.9 g/dL (ref 6.5–8.1)

## 2021-07-25 LAB — SARS CORONAVIRUS 2 BY RT PCR: SARS Coronavirus 2 by RT PCR: NEGATIVE

## 2021-07-25 LAB — STREP PNEUMONIAE URINARY ANTIGEN: Strep Pneumo Urinary Antigen: NEGATIVE

## 2021-07-25 LAB — MAGNESIUM: Magnesium: 1.7 mg/dL (ref 1.7–2.4)

## 2021-07-25 LAB — PHOSPHORUS: Phosphorus: 4.3 mg/dL (ref 2.5–4.6)

## 2021-07-25 MED ORDER — MELATONIN 5 MG PO TABS
10.0000 mg | ORAL_TABLET | Freq: Every evening | ORAL | Status: DC | PRN
  Administered 2021-07-25 – 2021-07-28 (×4): 10 mg via ORAL
  Filled 2021-07-25 (×4): qty 2

## 2021-07-25 MED ORDER — METHYLPREDNISOLONE SODIUM SUCC 125 MG IJ SOLR: 60.0000 mg | Freq: Every day | INTRAMUSCULAR | Status: DC

## 2021-07-25 MED ORDER — FAMOTIDINE 20 MG PO TABS
20.0000 mg | ORAL_TABLET | Freq: Two times a day (BID) | ORAL | Status: DC
  Administered 2021-07-25 – 2021-07-29 (×9): 20 mg via ORAL
  Filled 2021-07-25 (×9): qty 1

## 2021-07-25 MED ORDER — METHYLPREDNISOLONE SODIUM SUCC 125 MG IJ SOLR
60.0000 mg | Freq: Two times a day (BID) | INTRAMUSCULAR | Status: DC
  Administered 2021-07-25 – 2021-07-29 (×8): 60 mg via INTRAVENOUS
  Filled 2021-07-25 (×8): qty 2

## 2021-07-25 MED ORDER — GUAIFENESIN-CODEINE 100-10 MG/5ML PO SOLN
5.0000 mL | Freq: Four times a day (QID) | ORAL | Status: DC | PRN
  Administered 2021-07-26 – 2021-07-28 (×5): 5 mL via ORAL
  Filled 2021-07-25 (×2): qty 5
  Filled 2021-07-25: qty 10
  Filled 2021-07-25 (×2): qty 5

## 2021-07-25 MED ORDER — IPRATROPIUM-ALBUTEROL 0.5-2.5 (3) MG/3ML IN SOLN
3.0000 mL | Freq: Four times a day (QID) | RESPIRATORY_TRACT | Status: DC
Start: 2021-07-25 — End: 2021-07-28
  Administered 2021-07-25 – 2021-07-28 (×14): 3 mL via RESPIRATORY_TRACT
  Filled 2021-07-25 (×14): qty 3

## 2021-07-25 MED ORDER — IPRATROPIUM-ALBUTEROL 0.5-2.5 (3) MG/3ML IN SOLN
3.0000 mL | Freq: Three times a day (TID) | RESPIRATORY_TRACT | Status: DC
  Administered 2021-07-25: 3 mL via RESPIRATORY_TRACT
  Filled 2021-07-25: qty 3

## 2021-07-25 NOTE — Progress Notes (Signed)
PROGRESS NOTE    Dawn Moses  [id number] DOB: [birth year] DOA: 07/23/2021 PCP: Haywood Filler., FNP     Brief Narrative:  61 y.o. BF PMHx Fibromyalgia, chronic pain, COPD, sarcoidosis, Tobacco abuse, , HTN, chronic pancreatitis.   Presenting with LUQ ab pain and cough. She woke yesterday with left sided abdominal pain. She thought it might have been her pancreatitis, but typically that pain is on the right for her. She tried her home pain meds but they didn't help. She noticed that she was coughing so much that it was making her vomit. As the day went on, she felt more and more short of breath. She didn't have any fever or sick contacts. When her symptoms didn't improve yesterday evening, she decided to come to the ED for evaluation. She denies any other aggravating or alleviating factors.    Subjective: Afebrile overnight,   Assessment & Plan: Covid vaccination;   Principal Problem:   Multifocal pneumonia Active Problems:   COPD exacerbation ([place])   Essential hypertension   Sarcoidosis   Hypokalemia   Chronic pain   Chronic pancreatitis ([place])   Stage 3a chronic kidney disease (CKD) ([facility])   Conjunctivitis   Tobacco abuse   HLD (hyperlipidemia)    Multifocal PNA/Acute Respiratory Failure with Hypoxia -Not on home O2 -Complete 7-day course antibiotics - DuoNeb QID - Albuterol PRN - Guaifenesin + codeine PRN -Flutter valve -Incentive spirometry - Solu-Medrol 60 mg BID -7/8 respiratory virus panel pending -7/8 sputum pending -7/8 COVID pending   COPD exacerbation -See pneumonia  Sarcoidosis -See pneumonia   Tobacco abuse - counsel against further use - nicotine patch  Essential HTN -Home regimen     Hypokalemia -Potassium goal> 4  Hypomagnesmia - Magnesium goal > 2   Chronic pain - PRNs ordered   Chronic pancreatitis - continue home regimen     CKD stage 3a -At baseline -Avoid nephrotoxin   Conjunctivitis?     - Report of  conjunctivitis in ED; was given 1 dose of polytrim this morning at 0028hrs; no meds since, her eye appear normal on exam     - will have PRN drops   HLD     - continue home regimen         Mobility Assessment (last 72 hours)     Mobility Assessment     Row Name 07/24/21 1600           Does patient have an order for bedrest or is patient medically unstable No - Continue assessment       What is the highest level of mobility based on the progressive mobility assessment? Level 5 (Walks with assist in room/hall) - Balance while stepping forward/back and can walk in room with assist - Complete                        DVT prophylaxis: Lovenox Code Status: Full Family Communication:  Status is: Inpatient    Dispo: The patient is from: Home              Anticipated d/c is to: Home              Anticipated d/c date is: 3 days              Patient currently is not medically stable to d/c.      Consultants:    Procedures/Significant Events:    I have personally reviewed and interpreted all radiology studies and  my findings are as above.  VENTILATOR SETTINGS: Nasal cannula 7/8 Flow 3 L/min SPO2 98%  Cultures 7/8 respiratory virus panel pending 7/8 sputum pending 7/8 COVID pending  Antimicrobials: 7/8 COVID negative 7/8 respiratory virus panel pending   Devices    LINES / TUBES:      Continuous Infusions:  levofloxacin (LEVAQUIN) IV       Objective: Vitals:   07/24/21 2149 07/24/21 2331 07/24/21 2346 07/25/21 0319  BP: (!) 155/93 (!) 93/59 (!) 90/58 (!) 145/91  Pulse: 94 63 62 82  Resp:  18  18  Temp:  (!) 97.4 F (36.3 C)  97.8 F (36.6 C)  TempSrc:  Oral  Oral  SpO2: 98% 100%  98%    Intake/Output Summary (Last 24 hours) at 07/25/2021 0819 Last data filed at 07/24/2021 1823 Gross per 24 hour  Intake 590 ml  Output --  Net 590 ml   There were no vitals filed for this visit.  Examination:  General: A/O x4, positive acute  respiratory distress, cachectic Eyes: negative scleral hemorrhage, negative anisocoria, negative icterus ENT: Negative Runny nose, negative gingival bleeding, Neck:  Negative scars, masses, torticollis, lymphadenopathy, JVD Lungs: decreased air movement bilaterally LEFT>>> RIGHT, positive expiratory wheezes bilateral, negative crackles Cardiovascular: Regular rate and rhythm without murmur gallop or rub normal S1 and S2 Abdomen: negative abdominal pain, nondistended, positive soft, bowel sounds, no rebound, no ascites, no appreciable mass Extremities: No significant cyanosis, clubbing, or edema bilateral lower extremities Skin: Negative rashes, lesions, ulcers Psychiatric:  Negative depression, negative anxiety, negative fatigue, negative mania  Central nervous system:  Cranial nerves II through XII intact, tongue/uvula midline, all extremities muscle strength 5/5, sensation intact throughout, negative dysarthria, negative expressive aphasia, negative receptive aphasia.  .     Data Reviewed: Care during the described time interval was provided by me .  I have reviewed this patient's available data, including medical history, events of note, physical examination, and all test results as part of my evaluation.  CBC: Recent Labs  Lab 07/23/21 1818 07/24/21 1725  WBC 8.4 10.0  NEUTROABS 6.1 7.4  HGB 12.0 11.4*  HCT 34.3* 33.7*  MCV 98.6 100.9*  PLT 248 235   Basic Metabolic Panel: Recent Labs  Lab 07/23/21 1818 07/24/21 1725  NA 135 136  K 3.4* 3.8  CL 98 103  CO2 25 23  GLUCOSE 94 103*  BUN 9 7  CREATININE 1.14* 0.80  CALCIUM 9.1 9.1   GFR: CrCl cannot be calculated (Unknown ideal weight.). Liver Function Tests: Recent Labs  Lab 07/23/21 1818 07/24/21 1725  AST 21 18  ALT 18 17  ALKPHOS 74 71  BILITOT 0.7 1.1  PROT 7.7 7.4  ALBUMIN 3.6 3.4*   Recent Labs  Lab 07/23/21 1818  LIPASE 33   No results for input(s): "AMMONIA" in the last 168 hours. Coagulation  Profile: No results for input(s): "INR", "PROTIME" in the last 168 hours. Cardiac Enzymes: No results for input(s): "CKTOTAL", "CKMB", "CKMBINDEX", "TROPONINI" in the last 168 hours. BNP (last 3 results) No results for input(s): "PROBNP" in the last 8760 hours. HbA1C: No results for input(s): "HGBA1C" in the last 72 hours. CBG: No results for input(s): "GLUCAP" in the last 168 hours. Lipid Profile: No results for input(s): "CHOL", "HDL", "LDLCALC", "TRIG", "CHOLHDL", "LDLDIRECT" in the last 72 hours. Thyroid Function Tests: No results for input(s): "TSH", "T4TOTAL", "FREET4", "T3FREE", "THYROIDAB" in the last 72 hours. Anemia Panel: No results for input(s): "VITAMINB12", "FOLATE", "FERRITIN", "TIBC", "IRON", "RETICCTPCT"  in the last 72 hours. Sepsis Labs: No results for input(s): "PROCALCITON", "LATICACIDVEN" in the last 168 hours.  Recent Results (from the past 240 hour(s))  Blood culture (routine x 2)     Status: None (Preliminary result)   Collection Time: 07/24/21  2:45 AM   Specimen: BLOOD  Result Value Ref Range Status   Specimen Description   Final    BLOOD RIGHT ANTECUBITAL Performed at [facility], [address] [place], [place] [address]    Special Requests   Final    BOTTLES DRAWN AEROBIC AND ANAEROBIC Blood Culture adequate volume Performed at [facility], [address] [place], [place] [address]    Culture PENDING  Incomplete   Report Status PENDING  Incomplete  Blood culture (routine x 2)     Status: None (Preliminary result)   Collection Time: 07/24/21  2:49 AM   Specimen: BLOOD  Result Value Ref Range Status   Specimen Description   Final    BLOOD LEFT ANTECUBITAL Performed at [facility], [address], [place], [place] [address]    Special Requests   Final    BOTTLES DRAWN AEROBIC AND ANAEROBIC Blood Culture adequate volume Performed at [facility], [address] [place], [place] [address]    Culture PENDING   Incomplete   Report Status PENDING  Incomplete         Radiology Studies: CT Angio Chest PE W and/or Wo Contrast  Result Date: 07/24/2021 CLINICAL DATA:  Pulmonary embolism suspected, positive D-dimer. Cough. History of asthma, COPD, and sarcoidosis. EXAM: CT ANGIOGRAPHY CHEST WITH CONTRAST TECHNIQUE: Multidetector CT imaging of the chest was performed using the standard protocol during bolus administration of intravenous contrast. Multiplanar CT image reconstructions and MIPs were obtained to evaluate the vascular anatomy. RADIATION DOSE REDUCTION: This exam was performed according to the departmental dose-optimization program which includes automated exposure control, adjustment of the mA and/or kV according to patient size and/or use of iterative reconstruction technique. CONTRAST:  54mL OMNIPAQUE IOHEXOL 350 MG/ML SOLN COMPARISON:  03/19/2021. FINDINGS: Cardiovascular: The heart is normal in size and there is no pericardial effusion. Scattered coronary artery calcifications are noted. Mild atherosclerotic calcification of the aorta without evidence of aneurysm. The pulmonary trunk is normal in caliber. No pulmonary artery filling defect is identified. Mediastinum/Nodes: The thyroid gland, trachea, and esophagus are within normal limits. Calcified mediastinal and hilar lymph nodes are noted. Enlarged lymph nodes are noted in the subcarinal space measuring up to 1.7 cm. An enlarged lymph node is noted at the left hilum measuring 1 cm. No axillary lymphadenopathy. Lungs/Pleura: Paraseptal and centrilobular emphysematous changes are present in the lungs and unchanged from the prior exam. Apical pleural thickening is noted bilaterally. No effusion or pneumothorax. Central bronchial wall thickening is noted bilaterally. Patchy opacities are noted in the upper lobes bilaterally with traction bronchiectasis and volume loss. There is a new peribronchovascular opacity in the posterior left upper lobe measuring  1.1 cm, series 4, image 45. Additional new nodular opacities are seen in the right middle lobe, axial images 56 and 50. There is consolidation in the left lower lobe. Upper Abdomen: No acute abnormality. Musculoskeletal: Degenerative changes are present in the thoracic spine. No acute osseous abnormality. Review of the MIP images confirms the above findings. IMPRESSION: 1. No evidence of pulmonary embolism. 2. Consolidation in the left lower lobe and new patchy opacities in the right middle lobe and posterior segment of the  left upper lobe, may be infectious or inflammatory. 3. Extensive fibrotic changes in the lungs with an upper lobe predominance and likely related to patient's history of sarcoidosis and not significantly changed from the prior exam. 4. Aortic atherosclerosis. 5. Emphysema. Electronically Signed   By: Brett Fairy M.D.   On: 07/24/2021 01:41   CT ABDOMEN PELVIS W CONTRAST  Result Date: 07/23/2021 CLINICAL DATA:  Left upper quadrant pain with nausea and vomiting EXAM: CT ABDOMEN AND PELVIS WITH CONTRAST TECHNIQUE: Multidetector CT imaging of the abdomen and pelvis was performed using the standard protocol following bolus administration of intravenous contrast. RADIATION DOSE REDUCTION: This exam was performed according to the departmental dose-optimization program which includes automated exposure control, adjustment of the mA and/or kV according to patient size and/or use of iterative reconstruction technique. CONTRAST:  157mL OMNIPAQUE IOHEXOL 300 MG/ML  SOLN COMPARISON:  07/02/2021 FINDINGS: Lower chest: Patchy infiltrate is noted in the medial left lung base. No sizable effusion is seen. Hepatobiliary: No focal liver abnormality is seen. Status post cholecystectomy. No biliary dilatation. Pancreas: Unremarkable. No pancreatic ductal dilatation or surrounding inflammatory changes. Spleen: Normal in size without focal abnormality. Adrenals/Urinary Tract: Adrenal glands are within normal  limits. Kidneys demonstrate a normal enhancement pattern bilaterally. No renal calculi or obstructive changes are seen. Normal excretion is noted on delayed images. The bladder is within normal limits. Stomach/Bowel: No obstructive or inflammatory changes of the colon are seen. The appendix is air-filled and within normal limits. Small bowel and stomach are unremarkable. Vascular/Lymphatic: Aortic atherosclerosis. No enlarged abdominal or pelvic lymph nodes. Reproductive: Uterus and bilateral adnexa are unremarkable. Air is noted within the vaginal canal stable in appearance from the prior exam. Other: No abdominal wall hernia or abnormality. No abdominopelvic ascites. Musculoskeletal: Degenerative changes of the lumbar spine are noted. No acute bony abnormality is seen. IMPRESSION: Mild left lower lobe infiltrate. No acute abnormality is noted within the abdomen or pelvis. Electronically Signed   By: Inez Catalina M.D.   On: 07/23/2021 23:53        Scheduled Meds:  vitamin C  500 mg Oral Daily   atorvastatin  20 mg Oral QPM   DULoxetine  60 mg Oral QHS   enoxaparin (LOVENOX) injection  40 mg Subcutaneous Q24H   gabapentin  300 mg Oral BID   guaiFENesin  600 mg Oral BID   hydrochlorothiazide  12.5 mg Oral Daily   hydroxychloroquine  200 mg Oral Daily   ipratropium-albuterol  3 mL Nebulization TID   lipase/protease/amylase  24,000 Units Oral TID WC   loratadine  10 mg Oral Daily   metoprolol tartrate  25 mg Oral BID   montelukast  10 mg Oral QHS   multivitamin with minerals  1 tablet Oral Daily   nicotine  14 mg Transdermal Daily   potassium chloride  40 mEq Oral Daily   tiZANidine  4 mg Oral QHS   Continuous Infusions:  levofloxacin (LEVAQUIN) IV       LOS: 1 day    Time spent:40 min    Shalyn Koral, Geraldo Docker, MD [facility]   If 7PM-7AM, please contact night-coverage 07/25/2021, 8:19 AM

## 2021-07-26 DIAGNOSIS — J441 Chronic obstructive pulmonary disease with (acute) exacerbation: Secondary | ICD-10-CM | POA: Diagnosis not present

## 2021-07-26 DIAGNOSIS — K861 Other chronic pancreatitis: Secondary | ICD-10-CM | POA: Diagnosis not present

## 2021-07-26 DIAGNOSIS — H1032 Unspecified acute conjunctivitis, left eye: Secondary | ICD-10-CM | POA: Diagnosis not present

## 2021-07-26 DIAGNOSIS — J189 Pneumonia, unspecified organism: Secondary | ICD-10-CM | POA: Diagnosis not present

## 2021-07-26 LAB — CBC WITH DIFFERENTIAL/PLATELET
Abs Immature Granulocytes: 0.01 10*3/uL (ref 0.00–0.07)
Basophils Absolute: 0 10*3/uL (ref 0.0–0.1)
Basophils Relative: 0 %
Eosinophils Absolute: 0 10*3/uL (ref 0.0–0.5)
Eosinophils Relative: 0 %
HCT: 32.3 % — ABNORMAL LOW (ref 36.0–46.0)
Hemoglobin: 10.8 g/dL — ABNORMAL LOW (ref 12.0–15.0)
Immature Granulocytes: 0 %
Lymphocytes Relative: 7 %
Lymphs Abs: 0.5 10*3/uL — ABNORMAL LOW (ref 0.7–4.0)
MCH: 33.9 pg (ref 26.0–34.0)
MCHC: 33.4 g/dL (ref 30.0–36.0)
MCV: 101.3 fL — ABNORMAL HIGH (ref 80.0–100.0)
Monocytes Absolute: 0.2 10*3/uL (ref 0.1–1.0)
Monocytes Relative: 3 %
Neutro Abs: 6.5 10*3/uL (ref 1.7–7.7)
Neutrophils Relative %: 90 %
Platelets: 273 10*3/uL (ref 150–400)
RBC: 3.19 MIL/uL — ABNORMAL LOW (ref 3.87–5.11)
RDW: 12.2 % (ref 11.5–15.5)
WBC: 7.1 10*3/uL (ref 4.0–10.5)
nRBC: 0 % (ref 0.0–0.2)

## 2021-07-26 LAB — COMPREHENSIVE METABOLIC PANEL
ALT: 24 U/L (ref 0–44)
AST: 32 U/L (ref 15–41)
Albumin: 3.2 g/dL — ABNORMAL LOW (ref 3.5–5.0)
Alkaline Phosphatase: 89 U/L (ref 38–126)
Anion gap: 9 (ref 5–15)
BUN: 15 mg/dL (ref 6–20)
CO2: 26 mmol/L (ref 22–32)
Calcium: 9.5 mg/dL (ref 8.9–10.3)
Chloride: 103 mmol/L (ref 98–111)
Creatinine, Ser: 1.12 mg/dL — ABNORMAL HIGH (ref 0.44–1.00)
GFR, Estimated: 57 mL/min — ABNORMAL LOW (ref >60)
Glucose, Bld: 146 mg/dL — ABNORMAL HIGH (ref 70–99)
Potassium: 5 mmol/L (ref 3.5–5.1)
Sodium: 138 mmol/L (ref 135–145)
Total Bilirubin: 0.5 mg/dL (ref 0.3–1.2)
Total Protein: 7.4 g/dL (ref 6.5–8.1)

## 2021-07-26 LAB — PHOSPHORUS: Phosphorus: 3 mg/dL (ref 2.5–4.6)

## 2021-07-26 LAB — MAGNESIUM: Magnesium: 1.8 mg/dL (ref 1.7–2.4)

## 2021-07-26 MED ORDER — STERILE WATER FOR INJECTION IJ SOLN
INTRAMUSCULAR | Status: AC
  Filled 2021-07-26: qty 10

## 2021-07-26 NOTE — Progress Notes (Signed)
PROGRESS NOTE    Dawn Moses  [id number] DOB: 02-Nov-1960 DOA: 07/23/2021 PCP: Haywood Filler., FNP     Brief Narrative:  61 y.o. BF PMHx Fibromyalgia, chronic pain, COPD, sarcoidosis, Tobacco abuse, , HTN, chronic pancreatitis.   Presenting with LUQ ab pain and cough. She woke yesterday with left sided abdominal pain. She thought it might have been her pancreatitis, but typically that pain is on the right for her. She tried her home pain meds but they didn't help. She noticed that she was coughing so much that it was making her vomit. As the day went on, she felt more and more short of breath. She didn't have any fever or sick contacts. When her symptoms didn't improve yesterday evening, she decided to come to the ED for evaluation. She denies any other aggravating or alleviating factors.    Subjective: 7/9 afebrile overnight.  Slightly elevated temperature Tmax 37.3 C.  Patient feeling improved today.   Assessment & Plan: Covid vaccination;   Principal Problem:   Multifocal pneumonia Active Problems:   COPD exacerbation ([facility])   Essential hypertension   Sarcoidosis   Hypokalemia   Chronic pain   Chronic pancreatitis ([place])   Stage 3a chronic kidney disease (CKD) ([facility])   Conjunctivitis   Tobacco abuse   HLD (hyperlipidemia)    Multifocal PNA/Acute Respiratory Failure with Hypoxia -Not on home O2 -Complete 7-day course antibiotics - DuoNeb QID - Albuterol PRN - Guaifenesin + codeine PRN -Flutter valve -Incentive spirometry - Solu-Medrol 60 mg BID -7/8 respiratory virus panel negative -7/8 sputum negative -7/8 COVID negative  COPD exacerbation -See pneumonia  Sarcoidosis -See pneumonia   Tobacco abuse - counsel against further use - nicotine patch  Essential HTN -Home regimen     Hypokalemia -Potassium goal> 4  Hypomagnesmia - Magnesium goal > 2   Chronic pain - PRNs ordered   Chronic pancreatitis - continue home regimen     CKD  stage 3a -At baseline -Avoid nephrotoxin   Conjunctivitis?     - Report of conjunctivitis in ED; was given 1 dose of polytrim this morning at 0028hrs; no meds since, her eye appear normal on exam     - will have PRN drops   HLD     - continue home regimen         Mobility Assessment (last 72 hours)     Mobility Assessment     Row Name 07/25/21 1241 07/24/21 1600         Does patient have an order for bedrest or is patient medically unstable No - Continue assessment No - Continue assessment      What is the highest level of mobility based on the progressive mobility assessment? Level 6 (Walks independently in room and hall) - Balance while walking in room without assist - Complete Level 5 (Walks with assist in room/hall) - Balance while stepping forward/back and can walk in room with assist - Complete                       DVT prophylaxis: Lovenox Code Status: Full Family Communication:  Status is: Inpatient    Dispo: The patient is from: Home              Anticipated d/c is to: Home              Anticipated d/c date is: 3 days  Patient currently is not medically stable to d/c.      Consultants:    Procedures/Significant Events:    I have personally reviewed and interpreted all radiology studies and my findings are as above.  VENTILATOR SETTINGS: Nasal cannula 7/9 Flow 2 L/min SPO2 98%  Cultures 7/8 respiratory virus panel negative  7/8 sputum pending 7/8 COVID negative  Antimicrobials: Anti-infectives (From admission, onward)    Start     Ordered Stop   07/25/21 1000  levofloxacin (LEVAQUIN) IVPB 500 mg        07/24/21 1603     07/25/21 1000  hydroxychloroquine (PLAQUENIL) tablet 200 mg        07/24/21 1659     07/24/21 0200  levofloxacin (LEVAQUIN) IVPB 750 mg        07/24/21 0157 07/24/21 0731        Devices    LINES / TUBES:      Continuous Infusions:  levofloxacin (LEVAQUIN) IV 500 mg (07/26/21 1039)      Objective: Vitals:   07/25/21 2057 07/25/21 2206 07/26/21 0552 07/26/21 0843  BP: (!) 148/91 137/85 (!) 154/97   Pulse: 87 97 86   Resp: 18  18   Temp: 99.1 F (37.3 C)  98.4 F (36.9 C)   TempSrc: Oral  Oral   SpO2: 98%  99% 98%   No intake or output data in the 24 hours ending 07/26/21 1224  There were no vitals filed for this visit.  Examination:  General: A/O x4, positive acute respiratory distress, cachectic Eyes: negative scleral hemorrhage, negative anisocoria, negative icterus ENT: Negative Runny nose, negative gingival bleeding, Neck:  Negative scars, masses, torticollis, lymphadenopathy, JVD Lungs: decreased air movement bilaterally LEFT>>> RIGHT (7/9 significant decrease from 7/8), positive expiratory wheezes bilateral, negative crackles Cardiovascular: Regular rate and rhythm without murmur gallop or rub normal S1 and S2 Abdomen: negative abdominal pain, nondistended, positive soft, bowel sounds, no rebound, no ascites, no appreciable mass Extremities: No significant cyanosis, clubbing, or edema bilateral lower extremities Skin: Negative rashes, lesions, ulcers Psychiatric:  Negative depression, negative anxiety, negative fatigue, negative mania  Central nervous system:  Cranial nerves II through XII intact, tongue/uvula midline, all extremities muscle strength 5/5, sensation intact throughout, negative dysarthria, negative expressive aphasia, negative receptive aphasia.  .     Data Reviewed: Care during the described time interval was provided by me .  I have reviewed this patient's available data, including medical history, events of note, physical examination, and all test results as part of my evaluation.  CBC: Recent Labs  Lab 07/23/21 1818 07/24/21 1725 07/25/21 0906 07/26/21 0603  WBC 8.4 10.0 7.8 7.1  NEUTROABS 6.1 7.4 5.4 6.5  HGB 12.0 11.4* 10.5* 10.8*  HCT 34.3* 33.7* 31.6* 32.3*  MCV 98.6 100.9* 102.6* 101.3*  PLT 248 235 245 273     Basic Metabolic Panel: Recent Labs  Lab 07/23/21 1818 07/24/21 1725 07/25/21 0906 07/26/21 0603  NA 135 136 135 138  K 3.4* 3.8 3.8 5.0  CL 98 103 103 103  CO2 25 23 21* 26  GLUCOSE 94 103* 108* 146*  BUN 9 7 10 15   CREATININE 1.14* 0.80 1.12* 1.12*  CALCIUM 9.1 9.1 8.9 9.5  MG  --   --  1.7 1.8  PHOS  --   --  4.3 3.0    GFR: CrCl cannot be calculated (Unknown ideal weight.). Liver Function Tests: Recent Labs  Lab 07/23/21 1818 07/24/21 1725 07/25/21 0906 07/26/21 0603  AST 21  18 24 32  ALT 18 17 18 24   ALKPHOS 74 71 75 89  BILITOT 0.7 1.1 0.7 0.5  PROT 7.7 7.4 6.9 7.4  ALBUMIN 3.6 3.4* 3.1* 3.2*    Recent Labs  Lab 07/23/21 1818  LIPASE 33    No results for input(s): "AMMONIA" in the last 168 hours. Coagulation Profile: No results for input(s): "INR", "PROTIME" in the last 168 hours. Cardiac Enzymes: No results for input(s): "CKTOTAL", "CKMB", "CKMBINDEX", "TROPONINI" in the last 168 hours. BNP (last 3 results) No results for input(s): "PROBNP" in the last 8760 hours. HbA1C: No results for input(s): "HGBA1C" in the last 72 hours. CBG: No results for input(s): "GLUCAP" in the last 168 hours. Lipid Profile: No results for input(s): "CHOL", "HDL", "LDLCALC", "TRIG", "CHOLHDL", "LDLDIRECT" in the last 72 hours. Thyroid Function Tests: No results for input(s): "TSH", "T4TOTAL", "FREET4", "T3FREE", "THYROIDAB" in the last 72 hours. Anemia Panel: No results for input(s): "VITAMINB12", "FOLATE", "FERRITIN", "TIBC", "IRON", "RETICCTPCT" in the last 72 hours. Sepsis Labs: No results for input(s): "PROCALCITON", "LATICACIDVEN" in the last 168 hours.  Recent Results (from the past 240 hour(s))  Blood culture (routine x 2)     Status: None (Preliminary result)   Collection Time: 07/24/21  2:45 AM   Specimen: BLOOD  Result Value Ref Range Status   Specimen Description   Final    BLOOD RIGHT ANTECUBITAL Performed at [facility], [address], [place], [place]    Special Requests   Final    BOTTLES DRAWN AEROBIC AND ANAEROBIC Blood Culture adequate volume Performed at [facility], [address] [place], [place]    Culture   Final    NO GROWTH 2 DAYS Performed at [facility], [address] [place], [place]    Report Status PENDING  Incomplete  Blood culture (routine x 2)     Status: None (Preliminary result)   Collection Time: 07/24/21  2:49 AM   Specimen: BLOOD  Result Value Ref Range Status   Specimen Description   Final    BLOOD LEFT ANTECUBITAL Performed at [facility], [address] [place], [place]    Special Requests   Final    BOTTLES DRAWN AEROBIC AND ANAEROBIC Blood Culture adequate volume Performed at [facility], [address] [place], [place]    Culture   Final    NO GROWTH 2 DAYS Performed at [facility], [address] [place], [place]    Report Status PENDING  Incomplete  Respiratory (~20 pathogens) panel by PCR     Status: None   Collection Time: 07/25/21  1:52 PM   Specimen: Nasopharyngeal Swab; Respiratory  Result Value Ref Range Status   Adenovirus NOT DETECTED NOT DETECTED Final   Coronavirus 229E NOT DETECTED NOT DETECTED Final    Comment: (NOTE) The Coronavirus on the Respiratory Panel, DOES NOT test for the novel  Coronavirus (2019 nCoV)    Coronavirus HKU1 NOT DETECTED NOT DETECTED Final   Coronavirus NL63 NOT DETECTED NOT DETECTED Final   Coronavirus OC43 NOT DETECTED NOT DETECTED Final   Metapneumovirus NOT DETECTED NOT DETECTED Final   Rhinovirus / Enterovirus NOT DETECTED NOT DETECTED Final   Influenza A NOT DETECTED NOT DETECTED Final   Influenza B NOT DETECTED NOT DETECTED Final   Parainfluenza Virus 1 NOT DETECTED NOT DETECTED Final   Parainfluenza Virus 2 NOT  DETECTED NOT DETECTED Final   Parainfluenza Virus 3 NOT DETECTED NOT DETECTED Final   Parainfluenza Virus 4  NOT DETECTED NOT DETECTED Final   Respiratory Syncytial Virus NOT DETECTED NOT DETECTED Final   Bordetella pertussis NOT DETECTED NOT DETECTED Final   Bordetella Parapertussis NOT DETECTED NOT DETECTED Final   Chlamydophila pneumoniae NOT DETECTED NOT DETECTED Final   Mycoplasma pneumoniae NOT DETECTED NOT DETECTED Final    Comment: Performed at [facility], [place] [address] [place], [place] [address]  SARS Coronavirus 2 by RT PCR (hospital order, performed in [facility] hospital lab) *cepheid single result test* Anterior Nasal Swab     Status: None   Collection Time: 07/25/21  1:55 PM   Specimen: Anterior Nasal Swab  Result Value Ref Range Status   SARS Coronavirus 2 by RT PCR NEGATIVE NEGATIVE Final    Comment: (NOTE) SARS-CoV-2 target nucleic acids are NOT DETECTED.  The SARS-CoV-2 RNA is generally detectable in upper and lower respiratory specimens during the acute phase of infection. The lowest concentration of SARS-CoV-2 viral copies this assay can detect is 250 copies / mL. A negative result does not preclude SARS-CoV-2 infection and should not be used as the sole basis for treatment or other patient management decisions.  A negative result may occur with improper specimen collection / handling, submission of specimen other than nasopharyngeal swab, presence of viral mutation(s) within the areas targeted by this assay, and inadequate number of viral copies (<250 copies / mL). A negative result must be combined with clinical observations, patient history, and epidemiological information.  Fact Sheet for Patients:   https://www.patel.info/  Fact Sheet for Healthcare Providers: https://hall.com/  This test is not yet approved or  cleared by the [place] FDA and has been authorized for detection and/or diagnosis of SARS-CoV-2 by FDA under an Emergency Use Authorization (EUA).  This EUA will remain in effect (meaning this  test can be used) for the duration of the COVID-19 declaration under Section 564(b)(1) of the Act, 21 U.S.C. section 360bbb-3(b)(1), unless the authorization is terminated or revoked sooner.  Performed at [facility], [place] [address], [place], [place] [address]          Radiology Studies: No results found.      Scheduled Meds:  vitamin C  500 mg Oral Daily   atorvastatin  20 mg Oral QPM   DULoxetine  60 mg Oral QHS   enoxaparin (LOVENOX) injection  40 mg Subcutaneous Q24H   famotidine  20 mg Oral BID   gabapentin  300 mg Oral BID   guaiFENesin  600 mg Oral BID   hydrochlorothiazide  12.5 mg Oral Daily   hydroxychloroquine  200 mg Oral Daily   ipratropium-albuterol  3 mL Nebulization Q6H   lipase/protease/amylase  24,000 Units Oral TID WC   loratadine  10 mg Oral Daily   methylPREDNISolone (SOLU-MEDROL) injection  60 mg Intravenous Q12H   metoprolol tartrate  25 mg Oral BID   montelukast  10 mg Oral QHS   multivitamin with minerals  1 tablet Oral Daily   nicotine  14 mg Transdermal Daily   potassium chloride  40 mEq Oral Daily   tiZANidine  4 mg Oral QHS   Continuous Infusions:  levofloxacin (LEVAQUIN) IV 500 mg (07/26/21 1039)     LOS: 2 days    Time spent:40 min    Mistina Coatney, Geraldo Docker, MD [facility]   If 7PM-7AM, please contact night-coverage 07/26/2021, 12:24 PM

## 2021-07-27 DIAGNOSIS — H1032 Unspecified acute conjunctivitis, left eye: Secondary | ICD-10-CM | POA: Diagnosis not present

## 2021-07-27 DIAGNOSIS — J189 Pneumonia, unspecified organism: Secondary | ICD-10-CM | POA: Diagnosis not present

## 2021-07-27 DIAGNOSIS — J441 Chronic obstructive pulmonary disease with (acute) exacerbation: Secondary | ICD-10-CM | POA: Diagnosis not present

## 2021-07-27 DIAGNOSIS — K861 Other chronic pancreatitis: Secondary | ICD-10-CM | POA: Diagnosis not present

## 2021-07-27 LAB — CBC WITH DIFFERENTIAL/PLATELET
Abs Immature Granulocytes: 0.07 10*3/uL (ref 0.00–0.07)
Basophils Absolute: 0 10*3/uL (ref 0.0–0.1)
Basophils Relative: 0 %
Eosinophils Absolute: 0 10*3/uL (ref 0.0–0.5)
Eosinophils Relative: 0 %
HCT: 34.9 % — ABNORMAL LOW (ref 36.0–46.0)
Hemoglobin: 11.8 g/dL — ABNORMAL LOW (ref 12.0–15.0)
Immature Granulocytes: 1 %
Lymphocytes Relative: 5 %
Lymphs Abs: 0.5 10*3/uL — ABNORMAL LOW (ref 0.7–4.0)
MCH: 34.4 pg — ABNORMAL HIGH (ref 26.0–34.0)
MCHC: 33.8 g/dL (ref 30.0–36.0)
MCV: 101.7 fL — ABNORMAL HIGH (ref 80.0–100.0)
Monocytes Absolute: 0.3 10*3/uL (ref 0.1–1.0)
Monocytes Relative: 4 %
Neutro Abs: 8 10*3/uL — ABNORMAL HIGH (ref 1.7–7.7)
Neutrophils Relative %: 90 %
Platelets: 322 10*3/uL (ref 150–400)
RBC: 3.43 MIL/uL — ABNORMAL LOW (ref 3.87–5.11)
RDW: 12.4 % (ref 11.5–15.5)
WBC: 8.9 10*3/uL (ref 4.0–10.5)
nRBC: 0 % (ref 0.0–0.2)

## 2021-07-27 LAB — COMPREHENSIVE METABOLIC PANEL
ALT: 31 U/L (ref 0–44)
AST: 39 U/L (ref 15–41)
Albumin: 3.4 g/dL — ABNORMAL LOW (ref 3.5–5.0)
Alkaline Phosphatase: 86 U/L (ref 38–126)
Anion gap: 9 (ref 5–15)
BUN: 17 mg/dL (ref 6–20)
CO2: 25 mmol/L (ref 22–32)
Calcium: 9.9 mg/dL (ref 8.9–10.3)
Chloride: 104 mmol/L (ref 98–111)
Creatinine, Ser: 1.17 mg/dL — ABNORMAL HIGH (ref 0.44–1.00)
GFR, Estimated: 54 mL/min — ABNORMAL LOW (ref >60)
Glucose, Bld: 125 mg/dL — ABNORMAL HIGH (ref 70–99)
Potassium: 4.9 mmol/L (ref 3.5–5.1)
Sodium: 138 mmol/L (ref 135–145)
Total Bilirubin: 0.4 mg/dL (ref 0.3–1.2)
Total Protein: 7.8 g/dL (ref 6.5–8.1)

## 2021-07-27 LAB — PHOSPHORUS: Phosphorus: 3.2 mg/dL (ref 2.5–4.6)

## 2021-07-27 LAB — LEGIONELLA PNEUMOPHILA SEROGP 1 UR AG: L. pneumophila Serogp 1 Ur Ag: NEGATIVE

## 2021-07-27 LAB — MAGNESIUM: Magnesium: 1.8 mg/dL (ref 1.7–2.4)

## 2021-07-27 MED ORDER — LEVOFLOXACIN 500 MG PO TABS
500.0000 mg | ORAL_TABLET | Freq: Every day | ORAL | Status: DC
  Administered 2021-07-28 – 2021-07-29 (×2): 500 mg via ORAL
  Filled 2021-07-27 (×2): qty 1

## 2021-07-27 MED ORDER — HYDRALAZINE HCL 10 MG PO TABS
10.0000 mg | ORAL_TABLET | Freq: Three times a day (TID) | ORAL | Status: DC
  Administered 2021-07-27 – 2021-07-28 (×3): 10 mg via ORAL
  Filled 2021-07-27 (×4): qty 1

## 2021-07-27 NOTE — Progress Notes (Signed)
  Transition of Care [facility]) Screening Note   Patient Details  Name: Dawn Moses Date of Birth: Jan 04, 1961   Transition of Care [facility]) CM/SW Contact:    Otelia Santee, LCSW Phone Number: 07/27/2021, 4:05 PM    Transition of Care Department [facility]) has reviewed patient and no TOC needs have been identified at this time. We will continue to monitor patient advancement through interdisciplinary progression rounds. If new patient transition needs arise, please place a TOC consult.

## 2021-07-27 NOTE — Progress Notes (Signed)
PROGRESS NOTE    Livier Hendel  MWU:[id number] DOB: 06-11-1960 DOA: 07/23/2021 PCP: Greer Ee., FNP     Brief Narrative:  61 y.o. BF PMHx Fibromyalgia, chronic pain, COPD, sarcoidosis, Tobacco abuse, , HTN, chronic pancreatitis.   Presenting with LUQ ab pain and cough. She woke yesterday with left sided abdominal pain. She thought it might have been her pancreatitis, but typically that pain is on the right for her. She tried her home pain meds but they didn't help. She noticed that she was coughing so much that it was making her vomit. As the day went on, she felt more and more short of breath. She didn't have any fever or sick contacts. When her symptoms didn't improve yesterday evening, she decided to come to the ED for evaluation. She denies any other aggravating or alleviating factors.    Subjective: 7/10 afebrile overnight, increasing BP   Assessment & Plan: Covid vaccination;   Principal Problem:   Multifocal pneumonia Active Problems:   COPD exacerbation (HCC)   Essential hypertension   Sarcoidosis   Hypokalemia   Chronic pain   Chronic pancreatitis (HCC)   Stage 3a chronic kidney disease (CKD) (HCC)   Conjunctivitis   Tobacco abuse   HLD (hyperlipidemia)    Multifocal PNA/Acute Respiratory Failure with Hypoxia -Not on home O2 -Complete 7-day course antibiotics - DuoNeb QID - Albuterol PRN - Guaifenesin + codeine PRN -Flutter valve -Incentive spirometry - Solu-Medrol 60 mg BID -7/8 respiratory virus panel negative -7/8 sputum negative -7/8 COVID negative  COPD exacerbation -See pneumonia  Sarcoidosis -See pneumonia   Tobacco abuse - counsel against further use - nicotine patch  Essential HTN -Home regimen - 7/10 Hydralazine 10 mg TID     Hypokalemia -Potassium goal> 4  Hypomagnesmia - Magnesium goal > 2   Chronic pain - PRNs ordered   Chronic pancreatitis - continue home regimen     CKD stage 3a Lab Results  Component  Value Date   CREATININE 1.17 (H) 07/27/2021   CREATININE 1.12 (H) 07/26/2021   CREATININE 1.12 (H) 07/25/2021   CREATININE 0.80 07/24/2021   CREATININE 1.14 (H) 07/23/2021  -At baseline -Avoid nephrotoxin   Conjunctivitis?     - Report of conjunctivitis in ED; was given 1 dose of polytrim this morning at 0028hrs; no meds since, her eye appear normal on exam     - will have PRN drops   HLD     - continue home regimen         Mobility Assessment (last 72 hours)     Mobility Assessment     Row Name 07/26/21 0750 07/25/21 1241 07/24/21 1600       Does patient have an order for bedrest or is patient medically unstable No - Continue assessment No - Continue assessment No - Continue assessment     What is the highest level of mobility based on the progressive mobility assessment? Level 6 (Walks independently in room and hall) - Balance while walking in room without assist - Complete Level 6 (Walks independently in room and hall) - Balance while walking in room without assist - Complete Level 5 (Walks with assist in room/hall) - Balance while stepping forward/back and can walk in room with assist - Complete                      DVT prophylaxis: Lovenox Code Status: Full Family Communication:  Status is: Inpatient    Dispo: The patient is  from: Home              Anticipated d/c is to: Home              Anticipated d/c date is: 1 day              Patient currently is not medically stable to d/c.      Consultants:    Procedures/Significant Events:    I have personally reviewed and interpreted all radiology studies and my findings are as above.  VENTILATOR SETTINGS: Nasal cannula 7/9 Flow 2 L/min SPO2 98%  Cultures 7/8 respiratory virus panel negative  7/8 sputum pending 7/8 COVID negative  Antimicrobials: Anti-infectives (From admission, onward)    Start     Ordered Stop   07/25/21 1000  levofloxacin (LEVAQUIN) IVPB 500 mg        07/24/21 1603      07/25/21 1000  hydroxychloroquine (PLAQUENIL) tablet 200 mg        07/24/21 1659     07/24/21 0200  levofloxacin (LEVAQUIN) IVPB 750 mg        07/24/21 0157 07/24/21 0731        Devices    LINES / TUBES:      Continuous Infusions:     Objective: Vitals:   07/27/21 0511 07/27/21 0628 07/27/21 0807 07/27/21 1146  BP: (!) 184/96 (!) 160/97  (!) 161/97  Pulse: 87 66  80  Resp:    16  Temp:    97.6 F (36.4 C)  TempSrc:    Oral  SpO2:   96% 97%    Intake/Output Summary (Last 24 hours) at 07/27/2021 1310 Last data filed at 07/27/2021 0946 Gross per 24 hour  Intake 240 ml  Output --  Net 240 ml    There were no vitals filed for this visit.  Examination:  General: A/O x4, positive acute respiratory distress, cachectic Eyes: negative scleral hemorrhage, negative anisocoria, negative icterus ENT: Negative Runny nose, negative gingival bleeding, Neck:  Negative scars, masses, torticollis, lymphadenopathy, JVD Lungs: decreased air movement bilaterally LEFT>>> RIGHT (7/9 significant decrease from 7/8), positive expiratory wheezes bilateral, negative crackles Cardiovascular: Regular rate and rhythm without murmur gallop or rub normal S1 and S2 Abdomen: negative abdominal pain, nondistended, positive soft, bowel sounds, no rebound, no ascites, no appreciable mass Extremities: No significant cyanosis, clubbing, or edema bilateral lower extremities Skin: Negative rashes, lesions, ulcers Psychiatric:  Negative depression, negative anxiety, negative fatigue, negative mania  Central nervous system:  Cranial nerves II through XII intact, tongue/uvula midline, all extremities muscle strength 5/5, sensation intact throughout, negative dysarthria, negative expressive aphasia, negative receptive aphasia.  .     Data Reviewed: Care during the described time interval was provided by me .  I have reviewed this patient's available data, including medical history, events of note,  physical examination, and all test results as part of my evaluation.  CBC: Recent Labs  Lab 07/23/21 1818 07/24/21 1725 07/25/21 0906 07/26/21 0603 07/27/21 0528  WBC 8.4 10.0 7.8 7.1 8.9  NEUTROABS 6.1 7.4 5.4 6.5 8.0*  HGB 12.0 11.4* 10.5* 10.8* 11.8*  HCT 34.3* 33.7* 31.6* 32.3* 34.9*  MCV 98.6 100.9* 102.6* 101.3* 101.7*  PLT 248 235 245 273 [id number]    Basic Metabolic Panel: Recent Labs  Lab 07/23/21 1818 07/24/21 1725 07/25/21 0906 07/26/21 0603 07/27/21 0528  NA 135 136 135 138 138  K 3.4* 3.8 3.8 5.0 4.9  CL 98 103 103 103 104  CO2 25 23  21* 26 25  GLUCOSE 94 103* 108* 146* 125*  BUN 9 7 10 15 17   CREATININE 1.14* 0.80 1.12* 1.12* 1.17*  CALCIUM 9.1 9.1 8.9 9.5 9.9  MG  --   --  1.7 1.8 1.8  PHOS  --   --  4.3 3.0 3.2    GFR: CrCl cannot be calculated (Unknown ideal weight.). Liver Function Tests: Recent Labs  Lab 07/23/21 1818 07/24/21 1725 07/25/21 0906 07/26/21 0603 07/27/21 0528  AST 21 18 24  32 39  ALT 18 17 18 24 31   ALKPHOS 74 71 75 89 86  BILITOT 0.7 1.1 0.7 0.5 0.4  PROT 7.7 7.4 6.9 7.4 7.8  ALBUMIN 3.6 3.4* 3.1* 3.2* 3.4*    Recent Labs  Lab 07/23/21 1818  LIPASE 33    No results for input(s): "AMMONIA" in the last 168 hours. Coagulation Profile: No results for input(s): "INR", "PROTIME" in the last 168 hours. Cardiac Enzymes: No results for input(s): "CKTOTAL", "CKMB", "CKMBINDEX", "TROPONINI" in the last 168 hours. BNP (last 3 results) No results for input(s): "PROBNP" in the last 8760 hours. HbA1C: No results for input(s): "HGBA1C" in the last 72 hours. CBG: No results for input(s): "GLUCAP" in the last 168 hours. Lipid Profile: No results for input(s): "CHOL", "HDL", "LDLCALC", "TRIG", "CHOLHDL", "LDLDIRECT" in the last 72 hours. Thyroid Function Tests: No results for input(s): "TSH", "T4TOTAL", "FREET4", "T3FREE", "THYROIDAB" in the last 72 hours. Anemia Panel: No results for input(s): "VITAMINB12", "FOLATE", "FERRITIN",  "TIBC", "IRON", "RETICCTPCT" in the last 72 hours. Sepsis Labs: No results for input(s): "PROCALCITON", "LATICACIDVEN" in the last 168 hours.  Recent Results (from the past 240 hour(s))  Blood culture (routine x 2)     Status: None (Preliminary result)   Collection Time: 07/24/21  2:45 AM   Specimen: BLOOD  Result Value Ref Range Status   Specimen Description   Final    BLOOD RIGHT ANTECUBITAL Performed at [facility], [place] [address] [place], [place] [address]    Special Requests   Final    BOTTLES DRAWN AEROBIC AND ANAEROBIC Blood Culture adequate volume Performed at [facility], [place] [address] [place], [place] [address]    Culture   Final    NO GROWTH 3 DAYS Performed at [facility], [place] [address] [place], [place] [address]    Report Status PENDING  Incomplete  Blood culture (routine x 2)     Status: None (Preliminary result)   Collection Time: 07/24/21  2:49 AM   Specimen: BLOOD  Result Value Ref Range Status   Specimen Description   Final    BLOOD LEFT ANTECUBITAL Performed at [facility], [place] [address] [place], [place] [address]    Special Requests   Final    BOTTLES DRAWN AEROBIC AND ANAEROBIC Blood Culture adequate volume Performed at [facility], [address] [place], [place] [address]    Culture   Final    NO GROWTH 3 DAYS Performed at [facility], [place] [address] [place],  [address]    Report Status PENDING  Incomplete  Respiratory (~20 pathogens) panel by PCR     Status: None   Collection Time: 07/25/21  1:52 PM   Specimen: Nasopharyngeal Swab; Respiratory  Result Value Ref Range Status   Adenovirus NOT DETECTED NOT DETECTED Final   Coronavirus 229E NOT DETECTED NOT DETECTED Final    Comment: (NOTE) The Coronavirus on the Respiratory Panel,  DOES NOT test for the novel  Coronavirus (2019 nCoV)    Coronavirus HKU1 NOT DETECTED NOT DETECTED Final   Coronavirus NL63 NOT DETECTED NOT DETECTED  Final   Coronavirus OC43 NOT DETECTED NOT DETECTED Final   Metapneumovirus NOT DETECTED NOT DETECTED Final   Rhinovirus / Enterovirus NOT DETECTED NOT DETECTED Final   Influenza A NOT DETECTED NOT DETECTED Final   Influenza B NOT DETECTED NOT DETECTED Final   Parainfluenza Virus 1 NOT DETECTED NOT DETECTED Final   Parainfluenza Virus 2 NOT DETECTED NOT DETECTED Final   Parainfluenza Virus 3 NOT DETECTED NOT DETECTED Final   Parainfluenza Virus 4 NOT DETECTED NOT DETECTED Final   Respiratory Syncytial Virus NOT DETECTED NOT DETECTED Final   Bordetella pertussis NOT DETECTED NOT DETECTED Final   Bordetella Parapertussis NOT DETECTED NOT DETECTED Final   Chlamydophila pneumoniae NOT DETECTED NOT DETECTED Final   Mycoplasma pneumoniae NOT DETECTED NOT DETECTED Final    Comment: Performed at [facility], [address], [place], [place] [address]  SARS Coronavirus 2 by RT PCR (hospital order, performed in [facility] hospital lab) *cepheid single result test* Anterior Nasal Swab     Status: None   Collection Time: 07/25/21  1:55 PM   Specimen: Anterior Nasal Swab  Result Value Ref Range Status   SARS Coronavirus 2 by RT PCR NEGATIVE NEGATIVE Final    Comment: (NOTE) SARS-CoV-2 target nucleic acids are NOT DETECTED.  The SARS-CoV-2 RNA is generally detectable in upper and lower respiratory specimens during the acute phase of infection. The lowest concentration of SARS-CoV-2 viral copies this assay can detect is 250 copies / mL. A negative result does not preclude SARS-CoV-2 infection and should not be used as the sole basis for treatment or other patient management decisions.  A negative result may occur with improper specimen collection / handling, submission of specimen other than nasopharyngeal swab, presence of viral mutation(s) within the areas targeted by this assay, and inadequate number of viral copies (<250 copies / mL). A negative result must be combined with  clinical observations, patient history, and epidemiological information.  Fact Sheet for Patients:   RoadLapTop.co.za  Fact Sheet for Healthcare Providers: http://kim-miller.com/  This test is not yet approved or  cleared by the [place] FDA and has been authorized for detection and/or diagnosis of SARS-CoV-2 by FDA under an Emergency Use Authorization (EUA).  This EUA will remain in effect (meaning this test can be used) for the duration of the COVID-19 declaration under Section 564(b)(1) of the Act, 21 U.S.C. section 360bbb-3(b)(1), unless the authorization is terminated or revoked sooner.  Performed at [facility], [address], [place], [place] [address]          Radiology Studies: No results found.      Scheduled Meds:  vitamin C  500 mg Oral Daily   atorvastatin  20 mg Oral QPM   DULoxetine  60 mg Oral QHS   enoxaparin (LOVENOX) injection  40 mg Subcutaneous Q24H   famotidine  20 mg Oral BID   gabapentin  300 mg Oral BID   guaiFENesin  600 mg Oral BID   hydrochlorothiazide  12.5 mg Oral Daily   hydroxychloroquine  200 mg Oral Daily   ipratropium-albuterol  3 mL Nebulization Q6H   [START ON 07/28/2021] levofloxacin  500 mg Oral Daily   lipase/protease/amylase  24,000 Units Oral TID WC   loratadine  10 mg Oral Daily   methylPREDNISolone (SOLU-MEDROL) injection  60 mg Intravenous Q12H   metoprolol tartrate  25 mg Oral BID   montelukast  10 mg Oral QHS   multivitamin with minerals  1 tablet Oral Daily   nicotine  14 mg Transdermal Daily   potassium chloride  40 mEq Oral Daily   tiZANidine  4 mg Oral QHS   Continuous Infusions:     LOS: 3 days    Time spent:40 min    Karli Wickizer, Geraldo Docker, MD [facility]   If 7PM-7AM, please contact night-coverage 07/27/2021, 1:10 PM

## 2021-07-27 NOTE — Progress Notes (Signed)
PHARMACIST - PHYSICIAN COMMUNICATION DR:   Joseph Art CONCERNING: Antibiotic IV to Oral Route Change Policy  RECOMMENDATION: This patient is receiving levaquin by the intravenous route.  Based on criteria approved by the Pharmacy and Therapeutics Committee, the antibiotic(s) is/are being converted to the equivalent oral dose form(s).   DESCRIPTION: These criteria include: Patient being treated for a respiratory tract infection, urinary tract infection, cellulitis or clostridium difficile associated diarrhea if on metronidazole The patient is not neutropenic and does not exhibit a GI malabsorption state The patient is eating (either orally or via tube) and/or has been taking other orally administered medications for a least 24 hours The patient is improving clinically and has a Tmax < 100.5  If you have questions about this conversion, please contact the Pharmacy Department  []   [phone] )  ( [phone] []   [phone] )  [facility] []   [phone] )  [facility] []   [phone] )  [facility] [x]   [phone] )  [facility]     ( [phone], PharmD, BCPS 07/27/2021 10:17 AM

## 2021-07-28 DIAGNOSIS — K861 Other chronic pancreatitis: Secondary | ICD-10-CM | POA: Diagnosis not present

## 2021-07-28 DIAGNOSIS — H1032 Unspecified acute conjunctivitis, left eye: Secondary | ICD-10-CM | POA: Diagnosis not present

## 2021-07-28 DIAGNOSIS — J189 Pneumonia, unspecified organism: Secondary | ICD-10-CM | POA: Diagnosis not present

## 2021-07-28 DIAGNOSIS — J441 Chronic obstructive pulmonary disease with (acute) exacerbation: Secondary | ICD-10-CM | POA: Diagnosis not present

## 2021-07-28 LAB — COMPREHENSIVE METABOLIC PANEL
ALT: 34 U/L (ref 0–44)
AST: 36 U/L (ref 15–41)
Albumin: 3.3 g/dL — ABNORMAL LOW (ref 3.5–5.0)
Alkaline Phosphatase: 71 U/L (ref 38–126)
Anion gap: 8 (ref 5–15)
BUN: 21 mg/dL — ABNORMAL HIGH (ref 6–20)
CO2: 27 mmol/L (ref 22–32)
Calcium: 9.5 mg/dL (ref 8.9–10.3)
Chloride: 103 mmol/L (ref 98–111)
Creatinine, Ser: 1.03 mg/dL — ABNORMAL HIGH (ref 0.44–1.00)
GFR, Estimated: >60 mL/min (ref >60)
Glucose, Bld: 117 mg/dL — ABNORMAL HIGH (ref 70–99)
Potassium: 4.7 mmol/L (ref 3.5–5.1)
Sodium: 138 mmol/L (ref 135–145)
Total Bilirubin: 0.4 mg/dL (ref 0.3–1.2)
Total Protein: 7.2 g/dL (ref 6.5–8.1)

## 2021-07-28 LAB — CBC WITH DIFFERENTIAL/PLATELET
Abs Immature Granulocytes: 0.02 10*3/uL (ref 0.00–0.07)
Basophils Absolute: 0 10*3/uL (ref 0.0–0.1)
Basophils Relative: 0 %
Eosinophils Absolute: 0 10*3/uL (ref 0.0–0.5)
Eosinophils Relative: 0 %
HCT: 34.3 % — ABNORMAL LOW (ref 36.0–46.0)
Hemoglobin: 11.3 g/dL — ABNORMAL LOW (ref 12.0–15.0)
Immature Granulocytes: 0 %
Lymphocytes Relative: 10 %
Lymphs Abs: 0.8 10*3/uL (ref 0.7–4.0)
MCH: 33.7 pg (ref 26.0–34.0)
MCHC: 32.9 g/dL (ref 30.0–36.0)
MCV: 102.4 fL — ABNORMAL HIGH (ref 80.0–100.0)
Monocytes Absolute: 0.7 10*3/uL (ref 0.1–1.0)
Monocytes Relative: 9 %
Neutro Abs: 6.3 10*3/uL (ref 1.7–7.7)
Neutrophils Relative %: 81 %
Platelets: 337 10*3/uL (ref 150–400)
RBC: 3.35 MIL/uL — ABNORMAL LOW (ref 3.87–5.11)
RDW: 12.5 % (ref 11.5–15.5)
WBC: 7.8 10*3/uL (ref 4.0–10.5)
nRBC: 0 % (ref 0.0–0.2)

## 2021-07-28 LAB — EXPECTORATED SPUTUM ASSESSMENT W GRAM STAIN, RFLX TO RESP C

## 2021-07-28 LAB — PHOSPHORUS: Phosphorus: 3.3 mg/dL (ref 2.5–4.6)

## 2021-07-28 LAB — MAGNESIUM: Magnesium: 1.9 mg/dL (ref 1.7–2.4)

## 2021-07-28 MED ORDER — IPRATROPIUM-ALBUTEROL 0.5-2.5 (3) MG/3ML IN SOLN
3.0000 mL | Freq: Three times a day (TID) | RESPIRATORY_TRACT | Status: DC
Start: 2021-07-29 — End: 2021-07-29
  Administered 2021-07-29: 3 mL via RESPIRATORY_TRACT
  Filled 2021-07-28: qty 3

## 2021-07-28 MED ORDER — HYDRALAZINE HCL 25 MG PO TABS
25.0000 mg | ORAL_TABLET | Freq: Three times a day (TID) | ORAL | Status: DC
  Administered 2021-07-28 – 2021-07-29 (×3): 25 mg via ORAL
  Filled 2021-07-28 (×3): qty 1

## 2021-07-28 MED ORDER — MAGNESIUM HYDROXIDE 400 MG/5ML PO SUSP
400.0000 mL | Freq: Once | ORAL | Status: AC
  Administered 2021-07-28: 400 mL via RECTAL
  Filled 2021-07-28: qty 120

## 2021-07-28 NOTE — Progress Notes (Signed)
SATURATION QUALIFICATIONS: (This note is used to comply with regulatory documentation for home oxygen)  Patient Saturations on Room Air at Rest = 97%  Patient Saturations on Room Air while Ambulating = 89%  Pt briefly dropped to 89% upon ambulation and then she returned back to 95% with rest.   Patient Saturations on 0 Liters of oxygen while Ambulating = 95%  Please briefly explain why patient needs home oxygen:

## 2021-07-28 NOTE — Progress Notes (Signed)
PROGRESS NOTE    Dawn Moses  [id number] DOB: 03-26-1960 DOA: 07/23/2021 PCP: Haywood Filler., FNP     Brief Narrative:  62 y.o. BF PMHx Fibromyalgia, chronic pain, COPD, sarcoidosis, Tobacco abuse, , HTN, chronic pancreatitis.   Presenting with LUQ ab pain and cough. She woke yesterday with left sided abdominal pain. She thought it might have been her pancreatitis, but typically that pain is on the right for her. She tried her home pain meds but they didn't help. She noticed that she was coughing so much that it was making her vomit. As the day went on, she felt more and more short of breath. She didn't have any fever or sick contacts. When her symptoms didn't improve yesterday evening, she decided to come to the ED for evaluation. She denies any other aggravating or alleviating factors.    Subjective: 7/11 afebrile overnight, A/O x4, states feels significantly improved.   Assessment & Plan: Covid vaccination;   Principal Problem:   Multifocal pneumonia Active Problems:   COPD exacerbation ([place])   Essential hypertension   Sarcoidosis   Hypokalemia   Chronic pain   Chronic pancreatitis ([place])   Stage 3a chronic kidney disease (CKD) ([facility])   Conjunctivitis   Tobacco abuse   HLD (hyperlipidemia)    Multifocal PNA/Acute Respiratory Failure with Hypoxia -Not on home O2 -Complete 7-day course antibiotics - DuoNeb QID - Albuterol PRN - Guaifenesin + codeine PRN -Flutter valve -Incentive spirometry - Solu-Medrol 60 mg BID -7/8 respiratory virus panel negative -7/8 sputum negative -7/8 COVID negative SATURATION QUALIFICATIONS: (This note is used to comply with regulatory documentation for home oxygen) Patient Saturations on Room Air at Rest = 97% Patient Saturations on Room Air while Ambulating = 89%  Pt briefly dropped to 89% upon ambulation and then she returned back to 95% with rest.  Patient Saturations on 0 Liters of oxygen while Ambulating = 95% Please  briefly explain why patient needs home oxygen: -Patient meets criteria for home O2 - 2 L O2 via [place] titrate to maintain SPO2> 92% - Supply patient with [employer] portable O2 concentrator  COPD exacerbation -See pneumonia  Sarcoidosis -See pneumonia   Tobacco abuse - counsel against further use - nicotine patch  Essential HTN -Home regimen - 7/11 increase Hydralazine 25 mg TID     Hypokalemia -Potassium goal> 4  Hypomagnesmia - Magnesium goal > 2   Chronic pain - PRNs ordered   Chronic pancreatitis - continue home regimen     CKD stage 3a Lab Results  Component Value Date   CREATININE 1.03 (H) 07/28/2021   CREATININE 1.17 (H) 07/27/2021   CREATININE 1.12 (H) 07/26/2021   CREATININE 1.12 (H) 07/25/2021   CREATININE 0.80 07/24/2021  -At baseline -Avoid nephrotoxin   Conjunctivitis?     - Report of conjunctivitis in ED; was given 1 dose of polytrim this morning at 0028hrs; no meds since, her eye appear normal on exam     - will have PRN drops   HLD -Lipitor 20 mg daily        Mobility Assessment (last 72 hours)     Mobility Assessment     Row Name 07/27/21 2127 07/26/21 0750         Does patient have an order for bedrest or is patient medically unstable No - Continue assessment No - Continue assessment      What is the highest level of mobility based on the progressive mobility assessment? Level 6 (Walks independently in room  and hall) - Balance while walking in room without assist - Complete Level 6 (Walks independently in room and hall) - Balance while walking in room without assist - Complete                       DVT prophylaxis: Lovenox Code Status: Full Family Communication:  Status is: Inpatient    Dispo: The patient is from: Home              Anticipated d/c is to: Home              Anticipated d/c date is: 1 day              Patient currently is not medically stable to d/c.      Consultants:    Procedures/Significant  Events:    I have personally reviewed and interpreted all radiology studies and my findings are as above.  VENTILATOR SETTINGS: Room air 7/11    Cultures 7/8 respiratory virus panel negative  7/8 sputum NGTD 7/8 COVID negative  Antimicrobials: Anti-infectives (From admission, onward)    Start     Ordered Stop   07/25/21 1000  levofloxacin (LEVAQUIN) IVPB 500 mg        07/24/21 1603     07/25/21 1000  hydroxychloroquine (PLAQUENIL) tablet 200 mg        07/24/21 1659     07/24/21 0200  levofloxacin (LEVAQUIN) IVPB 750 mg        07/24/21 0157 07/24/21 0731        Devices    LINES / TUBES:      Continuous Infusions:     Objective: Vitals:   07/27/21 2032 07/28/21 0246 07/28/21 0602 07/28/21 0733  BP:   (!) 145/96   Pulse:   81   Resp:   18   Temp:   98 F (36.7 C)   TempSrc:   Oral   SpO2: 94% 93% 100% 96%    Intake/Output Summary (Last 24 hours) at 07/28/2021 1315 Last data filed at 07/27/2021 1900 Gross per 24 hour  Intake 472 ml  Output --  Net 472 ml    There were no vitals filed for this visit.  Examination:  General: A/O x4, negative acute respiratory distress, cachectic Eyes: negative scleral hemorrhage, negative anisocoria, negative icterus ENT: Negative Runny nose, negative gingival bleeding, Neck:  Negative scars, masses, torticollis, lymphadenopathy, JVD Lungs: clear to auscultation bilateral, negative wheezes bilateral, negative crackles Cardiovascular: Regular rate and rhythm without murmur gallop or rub normal S1 and S2 Abdomen: negative abdominal pain, nondistended, positive soft, bowel sounds, no rebound, no ascites, no appreciable mass Extremities: No significant cyanosis, clubbing, or edema bilateral lower extremities Skin: Negative rashes, lesions, ulcers Psychiatric:  Negative depression, negative anxiety, negative fatigue, negative mania  Central nervous system:  Cranial nerves II through XII intact, tongue/uvula midline, all  extremities muscle strength 5/5, sensation intact throughout, negative dysarthria, negative expressive aphasia, negative receptive aphasia.  .     Data Reviewed: Care during the described time interval was provided by me .  I have reviewed this patient's available data, including medical history, events of note, physical examination, and all test results as part of my evaluation.  CBC: Recent Labs  Lab 07/24/21 1725 07/25/21 0906 07/26/21 0603 07/27/21 0528 07/28/21 0551  WBC 10.0 7.8 7.1 8.9 7.8  NEUTROABS 7.4 5.4 6.5 8.0* 6.3  HGB 11.4* 10.5* 10.8* 11.8* 11.3*  HCT 33.7* 31.6* 32.3* 34.9* 34.3*  MCV 100.9* 102.6* 101.3* 101.7* 102.4*  PLT 235 245 273 322 337    Basic Metabolic Panel: Recent Labs  Lab 07/24/21 1725 07/25/21 0906 07/26/21 0603 07/27/21 0528 07/28/21 0551  NA 136 135 138 138 138  K 3.8 3.8 5.0 4.9 4.7  CL 103 103 103 104 103  CO2 23 21* 26 25 27   GLUCOSE 103* 108* 146* 125* 117*  BUN 7 10 15 17  21*  CREATININE 0.80 1.12* 1.12* 1.17* 1.03*  CALCIUM 9.1 8.9 9.5 9.9 9.5  MG  --  1.7 1.8 1.8 1.9  PHOS  --  4.3 3.0 3.2 3.3    GFR: CrCl cannot be calculated (Unknown ideal weight.). Liver Function Tests: Recent Labs  Lab 07/24/21 1725 07/25/21 0906 07/26/21 0603 07/27/21 0528 07/28/21 0551  AST 18 24 32 39 36  ALT 17 18 24 31  34  ALKPHOS 71 75 89 86 71  BILITOT 1.1 0.7 0.5 0.4 0.4  PROT 7.4 6.9 7.4 7.8 7.2  ALBUMIN 3.4* 3.1* 3.2* 3.4* 3.3*    Recent Labs  Lab 07/23/21 1818  LIPASE 33    No results for input(s): "AMMONIA" in the last 168 hours. Coagulation Profile: No results for input(s): "INR", "PROTIME" in the last 168 hours. Cardiac Enzymes: No results for input(s): "CKTOTAL", "CKMB", "CKMBINDEX", "TROPONINI" in the last 168 hours. BNP (last 3 results) No results for input(s): "PROBNP" in the last 8760 hours. HbA1C: No results for input(s): "HGBA1C" in the last 72 hours. CBG: No results for input(s): "GLUCAP" in the last 168  hours. Lipid Profile: No results for input(s): "CHOL", "HDL", "LDLCALC", "TRIG", "CHOLHDL", "LDLDIRECT" in the last 72 hours. Thyroid Function Tests: No results for input(s): "TSH", "T4TOTAL", "FREET4", "T3FREE", "THYROIDAB" in the last 72 hours. Anemia Panel: No results for input(s): "VITAMINB12", "FOLATE", "FERRITIN", "TIBC", "IRON", "RETICCTPCT" in the last 72 hours. Sepsis Labs: No results for input(s): "PROCALCITON", "LATICACIDVEN" in the last 168 hours.  Recent Results (from the past 240 hour(s))  Blood culture (routine x 2)     Status: None (Preliminary result)   Collection Time: 07/24/21  2:45 AM   Specimen: BLOOD  Result Value Ref Range Status   Specimen Description   Final    BLOOD RIGHT ANTECUBITAL Performed at [facility], [address] [place], [facility] [address]    Special Requests   Final    BOTTLES DRAWN AEROBIC AND ANAEROBIC Blood Culture adequate volume Performed at [facility], [address] [place], [facility] [address]    Culture   Final    NO GROWTH 4 DAYS Performed at [facility], [address] [place], [facility] [address]    Report Status PENDING  Incomplete  Blood culture (routine x 2)     Status: None (Preliminary result)   Collection Time: 07/24/21  2:49 AM   Specimen: BLOOD  Result Value Ref Range Status   Specimen Description   Final    BLOOD LEFT ANTECUBITAL Performed at [facility], [address] [place], [facility] [address]    Special Requests   Final    BOTTLES DRAWN AEROBIC AND ANAEROBIC Blood Culture adequate volume Performed at [facility], [address] [place], [facility] [address]    Culture   Final    NO GROWTH 4 DAYS Performed at [facility], [address], [place], [facility] [address]    Report Status PENDING  Incomplete  Respiratory (~20 pathogens) panel by PCR  Status: None   Collection Time: 07/25/21  1:52 PM   Specimen: Nasopharyngeal Swab; Respiratory  Result Value Ref  Range Status   Adenovirus NOT DETECTED NOT DETECTED Final   Coronavirus 229E NOT DETECTED NOT DETECTED Final    Comment: (NOTE) The Coronavirus on the Respiratory Panel, DOES NOT test for the novel  Coronavirus (2019 nCoV)    Coronavirus HKU1 NOT DETECTED NOT DETECTED Final   Coronavirus NL63 NOT DETECTED NOT DETECTED Final   Coronavirus OC43 NOT DETECTED NOT DETECTED Final   Metapneumovirus NOT DETECTED NOT DETECTED Final   Rhinovirus / Enterovirus NOT DETECTED NOT DETECTED Final   Influenza A NOT DETECTED NOT DETECTED Final   Influenza B NOT DETECTED NOT DETECTED Final   Parainfluenza Virus 1 NOT DETECTED NOT DETECTED Final   Parainfluenza Virus 2 NOT DETECTED NOT DETECTED Final   Parainfluenza Virus 3 NOT DETECTED NOT DETECTED Final   Parainfluenza Virus 4 NOT DETECTED NOT DETECTED Final   Respiratory Syncytial Virus NOT DETECTED NOT DETECTED Final   Bordetella pertussis NOT DETECTED NOT DETECTED Final   Bordetella Parapertussis NOT DETECTED NOT DETECTED Final   Chlamydophila pneumoniae NOT DETECTED NOT DETECTED Final   Mycoplasma pneumoniae NOT DETECTED NOT DETECTED Final    Comment: Performed at [facility], [address] [place], [place] [address]  SARS Coronavirus 2 by RT PCR (hospital order, performed in [facility] hospital lab) *cepheid single result test* Anterior Nasal Swab     Status: None   Collection Time: 07/25/21  1:55 PM   Specimen: Anterior Nasal Swab  Result Value Ref Range Status   SARS Coronavirus 2 by RT PCR NEGATIVE NEGATIVE Final    Comment: (NOTE) SARS-CoV-2 target nucleic acids are NOT DETECTED.  The SARS-CoV-2 RNA is generally detectable in upper and lower respiratory specimens during the acute phase of infection. The lowest concentration of SARS-CoV-2 viral copies this assay can detect is 250 copies / mL. A negative result does not preclude SARS-CoV-2 infection and should not be used as the sole basis for treatment or other patient management  decisions.  A negative result may occur with improper specimen collection / handling, submission of specimen other than nasopharyngeal swab, presence of viral mutation(s) within the areas targeted by this assay, and inadequate number of viral copies (<250 copies / mL). A negative result must be combined with clinical observations, patient history, and epidemiological information.  Fact Sheet for Patients:   https://www.patel.info/  Fact Sheet for Healthcare Providers: https://hall.com/  This test is not yet approved or  cleared by the [place] FDA and has been authorized for detection and/or diagnosis of SARS-CoV-2 by FDA under an Emergency Use Authorization (EUA).  This EUA will remain in effect (meaning this test can be used) for the duration of the COVID-19 declaration under Section 564(b)(1) of the Act, 21 U.S.C. section 360bbb-3(b)(1), unless the authorization is terminated or revoked sooner.  Performed at [facility], [place] [address], [place], [place] [address]   Expectorated Sputum Assessment w Gram Stain, Rflx to Resp Cult     Status: None   Collection Time: 07/27/21 11:35 PM   Specimen: Expectorated Sputum  Result Value Ref Range Status   Specimen Description EXPECTORATED SPUTUM  Final   Special Requests NONE  Final   Sputum evaluation   Final    THIS SPECIMEN IS ACCEPTABLE FOR SPUTUM CULTURE Performed at [facility], [place] [address], [place], [place] [address]    Report Status 07/28/2021  FINAL  Final  Culture, Respiratory w Gram Stain     Status: None (Preliminary result)   Collection Time: 07/27/21 11:35 PM  Result Value Ref Range Status   Specimen Description   Final    EXPECTORATED SPUTUM Performed at [facility], [address] [place], [place] [address]    Special Requests   Final    NONE Reflexed from [id number] Performed at [facility],  [address], [place], [place] [address]    Gram Stain   Final    MODERATE WBC PRESENT, PREDOMINANTLY PMN NO ORGANISMS SEEN Performed at [facility], [address], [place], [place] [address]    Culture PENDING  Incomplete   Report Status PENDING  Incomplete         Radiology Studies: No results found.      Scheduled Meds:  vitamin C  500 mg Oral Daily   atorvastatin  20 mg Oral QPM   DULoxetine  60 mg Oral QHS   enoxaparin (LOVENOX) injection  40 mg Subcutaneous Q24H   famotidine  20 mg Oral BID   gabapentin  300 mg Oral BID   guaiFENesin  600 mg Oral BID   hydrALAZINE  10 mg Oral Q8H   hydrochlorothiazide  12.5 mg Oral Daily   hydroxychloroquine  200 mg Oral Daily   ipratropium-albuterol  3 mL Nebulization Q6H   levofloxacin  500 mg Oral Daily   lipase/protease/amylase  24,000 Units Oral TID WC   loratadine  10 mg Oral Daily   methylPREDNISolone (SOLU-MEDROL) injection  60 mg Intravenous Q12H   metoprolol tartrate  25 mg Oral BID   montelukast  10 mg Oral QHS   multivitamin with minerals  1 tablet Oral Daily   nicotine  14 mg Transdermal Daily   potassium chloride  40 mEq Oral Daily   tiZANidine  4 mg Oral QHS   Continuous Infusions:     LOS: 4 days    Time spent:40 min    Obdulio Mash, Roselind Messier, MD [facility]   If 7PM-7AM, please contact night-coverage 07/28/2021, 1:15 PM

## 2021-07-29 DIAGNOSIS — G8929 Other chronic pain: Secondary | ICD-10-CM

## 2021-07-29 DIAGNOSIS — E785 Hyperlipidemia, unspecified: Secondary | ICD-10-CM

## 2021-07-29 LAB — CBC WITH DIFFERENTIAL/PLATELET
Abs Immature Granulocytes: 0.07 10*3/uL (ref 0.00–0.07)
Basophils Absolute: 0 10*3/uL (ref 0.0–0.1)
Basophils Relative: 0 %
Eosinophils Absolute: 0 10*3/uL (ref 0.0–0.5)
Eosinophils Relative: 0 %
HCT: 33.4 % — ABNORMAL LOW (ref 36.0–46.0)
Hemoglobin: 11.1 g/dL — ABNORMAL LOW (ref 12.0–15.0)
Immature Granulocytes: 1 %
Lymphocytes Relative: 9 %
Lymphs Abs: 0.7 10*3/uL (ref 0.7–4.0)
MCH: 33.7 pg (ref 26.0–34.0)
MCHC: 33.2 g/dL (ref 30.0–36.0)
MCV: 101.5 fL — ABNORMAL HIGH (ref 80.0–100.0)
Monocytes Absolute: 0.3 10*3/uL (ref 0.1–1.0)
Monocytes Relative: 4 %
Neutro Abs: 7.2 10*3/uL (ref 1.7–7.7)
Neutrophils Relative %: 86 %
Platelets: 359 10*3/uL (ref 150–400)
RBC: 3.29 MIL/uL — ABNORMAL LOW (ref 3.87–5.11)
RDW: 12.5 % (ref 11.5–15.5)
WBC: 8.4 10*3/uL (ref 4.0–10.5)
nRBC: 0 % (ref 0.0–0.2)

## 2021-07-29 LAB — CULTURE, BLOOD (ROUTINE X 2)
Culture: NO GROWTH
Culture: NO GROWTH
Special Requests: ADEQUATE
Special Requests: ADEQUATE

## 2021-07-29 LAB — COMPREHENSIVE METABOLIC PANEL
ALT: 39 U/L (ref 0–44)
AST: 33 U/L (ref 15–41)
Albumin: 3.1 g/dL — ABNORMAL LOW (ref 3.5–5.0)
Alkaline Phosphatase: 59 U/L (ref 38–126)
Anion gap: 8 (ref 5–15)
BUN: 26 mg/dL — ABNORMAL HIGH (ref 6–20)
CO2: 27 mmol/L (ref 22–32)
Calcium: 9.5 mg/dL (ref 8.9–10.3)
Chloride: 103 mmol/L (ref 98–111)
Creatinine, Ser: 1.19 mg/dL — ABNORMAL HIGH (ref 0.44–1.00)
GFR, Estimated: 53 mL/min — ABNORMAL LOW (ref >60)
Glucose, Bld: 112 mg/dL — ABNORMAL HIGH (ref 70–99)
Potassium: 4.8 mmol/L (ref 3.5–5.1)
Sodium: 138 mmol/L (ref 135–145)
Total Bilirubin: 0.3 mg/dL (ref 0.3–1.2)
Total Protein: 6.8 g/dL (ref 6.5–8.1)

## 2021-07-29 LAB — MAGNESIUM: Magnesium: 1.8 mg/dL (ref 1.7–2.4)

## 2021-07-29 LAB — PHOSPHORUS: Phosphorus: 3.8 mg/dL (ref 2.5–4.6)

## 2021-07-29 MED ORDER — LEVOFLOXACIN 500 MG PO TABS: 500.0000 mg | ORAL_TABLET | Freq: Every day | ORAL | 0 refills | Status: AC

## 2021-07-29 MED ORDER — PREDNISONE 20 MG PO TABS: 40.0000 mg | ORAL_TABLET | Freq: Every day | ORAL | 0 refills | Status: AC

## 2021-07-29 MED ORDER — GUAIFENESIN-DM 100-10 MG/5ML PO SYRP: 10.0000 mL | ORAL_SOLUTION | ORAL | 0 refills | Status: AC | PRN

## 2021-07-29 MED ORDER — GUAIFENESIN ER 600 MG PO TB12: 1200.0000 mg | ORAL_TABLET | Freq: Two times a day (BID) | ORAL | 0 refills | Status: AC

## 2021-07-29 NOTE — Discharge Summary (Signed)
Physician Discharge Summary  Dawn Moses [id number] DOB: Jul 17, 1960 DOA: 07/23/2021  PCP: Haywood Filler., FNP  Admit date: 07/23/2021 Discharge date: 07/29/2021  Admitted From: Home Disposition: Home  Recommendations for Outpatient Follow-up:  Follow up with PCP in 1 week with repeat CBC/BMP Outpatient follow-up with pulmonary Follow up in ED if symptoms worsen or new appear   Home Health: No Equipment/Devices: Oxygen via nasal cannula at 2 L/min on ambulation since her oxygen saturations dropped to the 80s on ambulation  Discharge Condition: Stable CODE STATUS: Full Diet recommendation: Heart healthy  Brief/Interim Summary: 60 year old female with history of fibromyalgia, chronic pain, COPD, sarcoidosis, tobacco abuse, hypertension, chronic pancreatitis presented with worsening shortness of breath/cough/left upper quadrant abdominal pain.  She was found to have multifocal pneumonia along with acute respiratory failure with hypoxia.  She was treated with IV antibiotics and Solu-Medrol and nebs/inhalers.  During the hospitalization, her condition has improved.  She will be discharged home today on oral antibiotics and oral prednisone with supplemental oxygen.  Outpatient follow-up with PCP and pulmonary.  Discharge Diagnoses:   Acute respiratory failure with hypoxia Multifocal pneumonia COPD exacerbation History of sarcoidosis Tobacco abuse: Ongoing -Currently on Levaquin and Solu-Medrol.  COVID-19/respiratory virus panel negative. -Oxygen saturations still dropped to the 80s on room air on ambulation.  She will need supplemental oxygen via nasal cannula at 2 L/min on ambulation on discharge -Respiratory status has much improved.  Patient feels ready to go home today.  Discharge home on 2 more days of oral Levaquin and prednisone 40 mg daily for 7 days.  Outpatient follow-up with pulmonary.  Counseled regarding tobacco cessation. -Continue home inhaler  regimen.  Hypertension -Continue home regimen.  Outpatient follow-up with PCP  Chronic pancreatitis -Continue home regimen  Chronic pain -Continue home regimen.  Outpatient follow-up with PCP/pain management  Chronically disease stage IIIa -At baseline.  Outpatient follow-up  Hyperlipidemia -Continue statin  Chronic macrocytic anemia -Questionable cause.  Hemoglobin stable.  Outpatient follow-up  Discharge Instructions  Discharge Instructions     Diet - low sodium heart healthy   Complete by: As directed    Increase activity slowly   Complete by: As directed       Allergies as of 07/29/2021       Reactions   Penicillins Hives   Did it involve swelling of the face/tongue/throat, SOB, or low BP? No Did it involve sudden or severe rash/hives, skin peeling, or any reaction on the inside of your mouth or nose? Yes Did you need to seek medical attention at a hospital or doctor's office? No When did it last happen?  Over 5 Years Ago     If all above answers are "NO", may proceed with cephalosporin use.   Azithromycin Nausea And Vomiting, Other (See Comments)   Severe abdominal cramps   Cephalexin Itching, Rash      Cyclobenzaprine    And "knocks me out"- "puts me to seep and I can't wake up"   Lisinopril Cough   Other reaction(s): Cough (ALLERGY/intolerance)        Medication List     TAKE these medications    Advair Diskus 500-50 MCG/ACT Aepb Generic drug: fluticasone-salmeterol Inhale 1 puff into the lungs in the morning and at bedtime.   albuterol 108 (90 Base) MCG/ACT inhaler Commonly known as: VENTOLIN HFA Inhale into the lungs every 6 (six) hours as needed for wheezing or shortness of breath.   albuterol (2.5 MG/3ML) 0.083% nebulizer solution Commonly known as: PROVENTIL  Take 3 mLs (2.5 mg total) by nebulization every 6 (six) hours as needed for wheezing or shortness of breath.   ASHWAGANDHA PO Take 1 tablet by mouth daily.   atorvastatin 20 MG  tablet Commonly known as: LIPITOR Take 20 mg by mouth every evening.   butalbital-acetaminophen-caffeine 50-325-40 MG tablet Commonly known as: FIORICET Take 1 tablet by mouth every 6 (six) hours as needed for headache or migraine.   cetirizine 10 MG tablet Commonly known as: ZYRTEC Take 10 mg by mouth daily.   Creon 24000-76000 units Cpep Generic drug: Pancrelipase (Lip-Prot-Amyl) Take 1 capsule by mouth 3 (three) times daily.   DULoxetine 60 MG capsule Commonly known as: CYMBALTA Take 60 mg by mouth at bedtime.   ELDERBERRY PO Take 2 tablets by mouth daily.   famotidine 20 MG tablet Commonly known as: PEPCID Take 20 mg by mouth 2 (two) times daily.   fluticasone 50 MCG/ACT nasal spray Commonly known as: FLONASE Place 1 spray into both nostrils daily as needed for allergies.   gabapentin 300 MG capsule Commonly known as: NEURONTIN Take 300 mg by mouth 2 (two) times daily.   guaiFENesin 600 MG 12 hr tablet Commonly known as: MUCINEX Take 2 tablets (1,200 mg total) by mouth 2 (two) times daily.   guaiFENesin-dextromethorphan 100-10 MG/5ML syrup Commonly known as: ROBITUSSIN DM Take 10 mLs by mouth every 4 (four) hours as needed for cough.   hydrochlorothiazide 12.5 MG tablet Commonly known as: HYDRODIURIL Take 1 tablet (12.5 mg total) by mouth daily.   HYDROcodone-acetaminophen 5-325 MG tablet Commonly known as: NORCO/VICODIN Take 1 tablet by mouth every 8 (eight) hours as needed for moderate pain or severe pain.   hydroxychloroquine 200 MG tablet Commonly known as: PLAQUENIL Take 200 mg by mouth daily.   levofloxacin 500 MG tablet Commonly known as: LEVAQUIN Take 1 tablet (500 mg total) by mouth daily. Start taking on: July 30, 2021   metoprolol tartrate 25 MG tablet Commonly known as: LOPRESSOR Take 1 tablet (25 mg total) by mouth 2 (two) times daily.   montelukast 10 MG tablet Commonly known as: SINGULAIR Take 10 mg by mouth at bedtime.    multivitamin with minerals Tabs tablet Take 1 tablet by mouth daily.   nicotine 7 mg/24hr patch Commonly known as: NICODERM CQ - dosed in mg/24 hr Place 7 mg onto the skin daily.   ondansetron 8 MG disintegrating tablet Commonly known as: Zofran ODT 8mg  ODT q8 hours prn nausea What changed:  how much to take how to take this when to take this reasons to take this additional instructions   polyethylene glycol 17 g packet Commonly known as: MIRALAX / GLYCOLAX Take 17 g by mouth daily as needed. What changed: reasons to take this   predniSONE 20 MG tablet Commonly known as: DELTASONE Take 2 tablets (40 mg total) by mouth daily with breakfast for 7 days. What changed:  medication strength how much to take how to take this when to take this additional instructions   senna-docusate 8.6-50 MG tablet Commonly known as: Senokot-S Take 1 tablet by mouth at bedtime as needed for mild constipation.   Spiriva HandiHaler 18 MCG inhalation capsule Generic drug: tiotropium Place 1 capsule into inhaler and inhale daily.   tiZANidine 4 MG tablet Commonly known as: ZANAFLEX Take 4 mg by mouth at bedtime.   traMADol 50 MG tablet Commonly known as: ULTRAM Take 50 mg by mouth every 8 (eight) hours as needed for moderate pain.  trimethoprim-polymyxin b ophthalmic solution Commonly known as: POLYTRIM Place 1 drop into the left eye daily as needed (conjunctivitis).   VITAMIN B12 PO Take 1 tablet by mouth daily.   vitamin C 500 MG tablet Commonly known as: ASCORBIC ACID Take 500 mg by mouth daily.               Durable Medical Equipment  (From admission, onward)           Start     Ordered   07/28/21 1921  For home use only DME oxygen  Once       Comments: SATURATION QUALIFICATIONS: (This note is used to comply with regulatory documentation for home oxygen) Patient Saturations on Room Air at Rest = 97% Patient Saturations on Room Air while Ambulating = 89%  Pt  briefly dropped to 89% upon ambulation and then she returned back to 95% with rest.  Patient Saturations on 0 Liters of oxygen while Ambulating = 95% Please briefly explain why patient needs home oxygen: -Patient meets criteria for home O2 - 2 L O2 via [place] titrate to maintain SPO2> 92% - Supply patient with [employer] portable O2 concentrator  Question Answer Comment  Length of Need Lifetime   Mode or (Route) Nasal cannula   Liters per Minute 2   Frequency Continuous (stationary and portable oxygen unit needed)   Oxygen conserving device Yes   Oxygen delivery system Gas      07/28/21 1920            Follow-up Information     Haywood Filler., FNP. Schedule an appointment as soon as possible for a visit in 1 week(s).   Specialty: Internal Medicine Contact information: [id number] [place] [address] [phone]                Allergies  Allergen Reactions   Penicillins Hives    Did it involve swelling of the face/tongue/throat, SOB, or low BP? No Did it involve sudden or severe rash/hives, skin peeling, or any reaction on the inside of your mouth or nose? Yes Did you need to seek medical attention at a hospital or doctor's office? No When did it last happen?  Over 67 Years Ago     If all above answers are "NO", may proceed with cephalosporin use.     Azithromycin Nausea And Vomiting and Other (See Comments)    Severe abdominal cramps    Cephalexin Itching and Rash        Cyclobenzaprine     And "knocks me out"- "puts me to seep and I can't wake up"    Lisinopril Cough    Other reaction(s): Cough (ALLERGY/intolerance)     Consultations: None   Procedures/Studies: CT Angio Chest PE W and/or Wo Contrast  Result Date: 07/24/2021 CLINICAL DATA:  Pulmonary embolism suspected, positive D-dimer. Cough. History of asthma, COPD, and sarcoidosis. EXAM: CT ANGIOGRAPHY CHEST WITH CONTRAST TECHNIQUE: Multidetector CT imaging of the chest  was performed using the standard protocol during bolus administration of intravenous contrast. Multiplanar CT image reconstructions and MIPs were obtained to evaluate the vascular anatomy. RADIATION DOSE REDUCTION: This exam was performed according to the departmental dose-optimization program which includes automated exposure control, adjustment of the mA and/or kV according to patient size and/or use of iterative reconstruction technique. CONTRAST:  21mL OMNIPAQUE IOHEXOL 350 MG/ML SOLN COMPARISON:  03/19/2021. FINDINGS: Cardiovascular: The heart is normal in size and there is no pericardial effusion. Scattered coronary artery  calcifications are noted. Mild atherosclerotic calcification of the aorta without evidence of aneurysm. The pulmonary trunk is normal in caliber. No pulmonary artery filling defect is identified. Mediastinum/Nodes: The thyroid gland, trachea, and esophagus are within normal limits. Calcified mediastinal and hilar lymph nodes are noted. Enlarged lymph nodes are noted in the subcarinal space measuring up to 1.7 cm. An enlarged lymph node is noted at the left hilum measuring 1 cm. No axillary lymphadenopathy. Lungs/Pleura: Paraseptal and centrilobular emphysematous changes are present in the lungs and unchanged from the prior exam. Apical pleural thickening is noted bilaterally. No effusion or pneumothorax. Central bronchial wall thickening is noted bilaterally. Patchy opacities are noted in the upper lobes bilaterally with traction bronchiectasis and volume loss. There is a new peribronchovascular opacity in the posterior left upper lobe measuring 1.1 cm, series 4, image 45. Additional new nodular opacities are seen in the right middle lobe, axial images 56 and 50. There is consolidation in the left lower lobe. Upper Abdomen: No acute abnormality. Musculoskeletal: Degenerative changes are present in the thoracic spine. No acute osseous abnormality. Review of the MIP images confirms the above  findings. IMPRESSION: 1. No evidence of pulmonary embolism. 2. Consolidation in the left lower lobe and new patchy opacities in the right middle lobe and posterior segment of the left upper lobe, may be infectious or inflammatory. 3. Extensive fibrotic changes in the lungs with an upper lobe predominance and likely related to patient's history of sarcoidosis and not significantly changed from the prior exam. 4. Aortic atherosclerosis. 5. Emphysema. Electronically Signed   By: Brett Fairy M.D.   On: 07/24/2021 01:41   CT ABDOMEN PELVIS W CONTRAST  Result Date: 07/23/2021 CLINICAL DATA:  Left upper quadrant pain with nausea and vomiting EXAM: CT ABDOMEN AND PELVIS WITH CONTRAST TECHNIQUE: Multidetector CT imaging of the abdomen and pelvis was performed using the standard protocol following bolus administration of intravenous contrast. RADIATION DOSE REDUCTION: This exam was performed according to the departmental dose-optimization program which includes automated exposure control, adjustment of the mA and/or kV according to patient size and/or use of iterative reconstruction technique. CONTRAST:  138mL OMNIPAQUE IOHEXOL 300 MG/ML  SOLN COMPARISON:  07/02/2021 FINDINGS: Lower chest: Patchy infiltrate is noted in the medial left lung base. No sizable effusion is seen. Hepatobiliary: No focal liver abnormality is seen. Status post cholecystectomy. No biliary dilatation. Pancreas: Unremarkable. No pancreatic ductal dilatation or surrounding inflammatory changes. Spleen: Normal in size without focal abnormality. Adrenals/Urinary Tract: Adrenal glands are within normal limits. Kidneys demonstrate a normal enhancement pattern bilaterally. No renal calculi or obstructive changes are seen. Normal excretion is noted on delayed images. The bladder is within normal limits. Stomach/Bowel: No obstructive or inflammatory changes of the colon are seen. The appendix is air-filled and within normal limits. Small bowel and stomach  are unremarkable. Vascular/Lymphatic: Aortic atherosclerosis. No enlarged abdominal or pelvic lymph nodes. Reproductive: Uterus and bilateral adnexa are unremarkable. Air is noted within the vaginal canal stable in appearance from the prior exam. Other: No abdominal wall hernia or abnormality. No abdominopelvic ascites. Musculoskeletal: Degenerative changes of the lumbar spine are noted. No acute bony abnormality is seen. IMPRESSION: Mild left lower lobe infiltrate. No acute abnormality is noted within the abdomen or pelvis. Electronically Signed   By: Inez Catalina M.D.   On: 07/23/2021 23:53      Subjective: Patient seen and examined at bedside.  Feels better and wants to go home today.  No overnight fever, nausea, vomiting reported.  Breathing better.  Discharge Exam: Vitals:   07/29/21 0428 07/29/21 0755  BP: (!) 167/99   Pulse: 70   Resp: 18 14  Temp: 98.1 F (36.7 C)   SpO2: 99% 100%    General: Pt is alert, awake, not in acute distress.  Currently on room air. Cardiovascular: rate controlled, S1/S2 + Respiratory: bilateral decreased breath sounds at bases with some scattered crackles Abdominal: Soft, NT, ND, bowel sounds + Extremities: no edema, no cyanosis    The results of significant diagnostics from this hospitalization (including imaging, microbiology, ancillary and laboratory) are listed below for reference.     Microbiology: Recent Results (from the past 240 hour(s))  Blood culture (routine x 2)     Status: None   Collection Time: 07/24/21  2:45 AM   Specimen: BLOOD  Result Value Ref Range Status   Specimen Description   Final    BLOOD RIGHT ANTECUBITAL Performed at [facility], [address], [place], [place] [address]    Special Requests   Final    BOTTLES DRAWN AEROBIC AND ANAEROBIC Blood Culture adequate volume Performed at [facility], [address] [place], [place] [address]    Culture   Final    NO GROWTH 5 DAYS Performed at [facility], [address] [place], [place] [address]    Report Status 07/29/2021 FINAL  Final  Blood culture (routine x 2)     Status: None   Collection Time: 07/24/21  2:49 AM   Specimen: BLOOD  Result Value Ref Range Status   Specimen Description   Final    BLOOD LEFT ANTECUBITAL Performed at [facility], [address], [place], [place] [address]    Special Requests   Final    BOTTLES DRAWN AEROBIC AND ANAEROBIC Blood Culture adequate volume Performed at [facility], [address] [place], [place] [address]    Culture   Final    NO GROWTH 5 DAYS Performed at [facility], [address], [place], [place] [address]    Report Status 07/29/2021 FINAL  Final  Respiratory (~20 pathogens) panel by PCR     Status: None   Collection Time: 07/25/21  1:52 PM   Specimen: Nasopharyngeal Swab; Respiratory  Result Value Ref Range Status   Adenovirus NOT DETECTED NOT DETECTED Final   Coronavirus 229E NOT DETECTED NOT DETECTED Final    Comment: (NOTE) The Coronavirus on the Respiratory Panel, DOES NOT test for the novel  Coronavirus (2019 nCoV)    Coronavirus HKU1 NOT DETECTED NOT DETECTED Final   Coronavirus NL63 NOT DETECTED NOT DETECTED Final   Coronavirus OC43 NOT DETECTED NOT DETECTED Final   Metapneumovirus NOT DETECTED NOT DETECTED Final   Rhinovirus / Enterovirus NOT DETECTED NOT DETECTED Final   Influenza A NOT DETECTED NOT DETECTED Final   Influenza B NOT DETECTED NOT DETECTED Final   Parainfluenza Virus 1 NOT DETECTED NOT DETECTED Final   Parainfluenza Virus 2 NOT DETECTED NOT DETECTED Final   Parainfluenza Virus 3 NOT DETECTED NOT DETECTED Final   Parainfluenza Virus 4 NOT DETECTED NOT DETECTED Final   Respiratory Syncytial Virus NOT DETECTED NOT DETECTED Final   Bordetella pertussis NOT DETECTED NOT DETECTED Final   Bordetella Parapertussis NOT DETECTED NOT DETECTED Final   Chlamydophila pneumoniae NOT DETECTED NOT DETECTED Final    Mycoplasma pneumoniae NOT DETECTED NOT DETECTED Final    Comment: Performed at [facility],  [address] [place], [place] [address]  SARS Coronavirus 2 by RT PCR (hospital order, performed in [facility] hospital lab) *cepheid single result test* Anterior Nasal Swab     Status: None   Collection Time: 07/25/21  1:55 PM   Specimen: Anterior Nasal Swab  Result Value Ref Range Status   SARS Coronavirus 2 by RT PCR NEGATIVE NEGATIVE Final    Comment: (NOTE) SARS-CoV-2 target nucleic acids are NOT DETECTED.  The SARS-CoV-2 RNA is generally detectable in upper and lower respiratory specimens during the acute phase of infection. The lowest concentration of SARS-CoV-2 viral copies this assay can detect is 250 copies / mL. A negative result does not preclude SARS-CoV-2 infection and should not be used as the sole basis for treatment or other patient management decisions.  A negative result may occur with improper specimen collection / handling, submission of specimen other than nasopharyngeal swab, presence of viral mutation(s) within the areas targeted by this assay, and inadequate number of viral copies (<250 copies / mL). A negative result must be combined with clinical observations, patient history, and epidemiological information.  Fact Sheet for Patients:   https://www.patel.info/  Fact Sheet for Healthcare Providers: https://hall.com/  This test is not yet approved or  cleared by the [place] FDA and has been authorized for detection and/or diagnosis of SARS-CoV-2 by FDA under an Emergency Use Authorization (EUA).  This EUA will remain in effect (meaning this test can be used) for the duration of the COVID-19 declaration under Section 564(b)(1) of the Act, 21 U.S.C. section 360bbb-3(b)(1), unless the authorization is terminated or revoked sooner.  Performed at [facility], [place] [address], [place],  [place] [address]   Expectorated Sputum Assessment w Gram Stain, Rflx to Resp Cult     Status: None   Collection Time: 07/27/21 11:35 PM   Specimen: Expectorated Sputum  Result Value Ref Range Status   Specimen Description EXPECTORATED SPUTUM  Final   Special Requests NONE  Final   Sputum evaluation   Final    THIS SPECIMEN IS ACCEPTABLE FOR SPUTUM CULTURE Performed at [facility], [place] [address], [place], [place] [address]    Report Status 07/28/2021 FINAL  Final  Culture, Respiratory w Gram Stain     Status: None (Preliminary result)   Collection Time: 07/27/21 11:35 PM  Result Value Ref Range Status   Specimen Description   Final    EXPECTORATED SPUTUM Performed at [place] [address], [place], [place] [address]    Special Requests   Final    NONE Reflexed from [id number] Performed at [facility], [place] [address] [place], [place] [address]    Gram Stain   Final    MODERATE WBC PRESENT, PREDOMINANTLY PMN NO ORGANISMS SEEN Performed at [facility], [place] [address] [place],  [address]    Culture PENDING  Incomplete   Report Status PENDING  Incomplete     Labs: BNP (last 3 results) No results for input(s): "BNP" in the last 8760 hours. Basic Metabolic Panel: Recent Labs  Lab 07/25/21 0906 07/26/21 0603 07/27/21 0528 07/28/21 0551 07/29/21 0646  NA 135 138 138 138 138  K 3.8 5.0 4.9 4.7 4.8  CL 103 103 104 103 103  CO2 21* 26 25 27 27   GLUCOSE 108* 146* 125* 117* 112*  BUN 10 15 17  21* 26*  CREATININE 1.12* 1.12* 1.17* 1.03* 1.19*  CALCIUM 8.9 9.5 9.9 9.5 9.5  MG 1.7 1.8 1.8 1.9 1.8  PHOS 4.3 3.0 3.2 3.3 3.8   Liver Function Tests: Recent Labs  Lab 07/25/21 0906 07/26/21 0603 07/27/21 0528 07/28/21 0551 07/29/21 0646  AST 24 32 39 36 33  ALT 18 24 31  34 39  ALKPHOS 75 89 86 71 59  BILITOT 0.7 0.5 0.4 0.4 0.3  PROT 6.9 7.4 7.8 7.2 6.8  ALBUMIN 3.1* 3.2* 3.4* 3.3* 3.1*   Recent Labs   Lab 07/23/21 1818  LIPASE 33   No results for input(s): "AMMONIA" in the last 168 hours. CBC: Recent Labs  Lab 07/25/21 0906 07/26/21 0603 07/27/21 0528 07/28/21 0551 07/29/21 0646  WBC 7.8 7.1 8.9 7.8 8.4  NEUTROABS 5.4 6.5 8.0* 6.3 7.2  HGB 10.5* 10.8* 11.8* 11.3* 11.1*  HCT 31.6* 32.3* 34.9* 34.3* 33.4*  MCV 102.6* 101.3* 101.7* 102.4* 101.5*  PLT 245 273 322 337 359   Cardiac Enzymes: No results for input(s): "CKTOTAL", "CKMB", "CKMBINDEX", "TROPONINI" in the last 168 hours. BNP: Invalid input(s): "POCBNP" CBG: No results for input(s): "GLUCAP" in the last 168 hours. D-Dimer No results for input(s): "DDIMER" in the last 72 hours. Hgb A1c No results for input(s): "HGBA1C" in the last 72 hours. Lipid Profile No results for input(s): "CHOL", "HDL", "LDLCALC", "TRIG", "CHOLHDL", "LDLDIRECT" in the last 72 hours. Thyroid function studies No results for input(s): "TSH", "T4TOTAL", "T3FREE", "THYROIDAB" in the last 72 hours.  Invalid input(s): "FREET3" Anemia work up No results for input(s): "VITAMINB12", "FOLATE", "FERRITIN", "TIBC", "IRON", "RETICCTPCT" in the last 72 hours. Urinalysis    Component Value Date/Time   COLORURINE AMBER (A) 07/23/2021 1815   APPEARANCEUR HAZY (A) 07/23/2021 1815   LABSPEC 1.020 07/23/2021 1815   PHURINE 6.0 07/23/2021 1815   GLUCOSEU NEGATIVE 07/23/2021 1815   HGBUR NEGATIVE 07/23/2021 1815   BILIRUBINUR SMALL (A) 07/23/2021 1815   KETONESUR NEGATIVE 07/23/2021 1815   PROTEINUR 100 (A) 07/23/2021 1815   UROBILINOGEN 1.0 10/13/2013 0233   NITRITE NEGATIVE 07/23/2021 1815   LEUKOCYTESUR TRACE (A) 07/23/2021 1815   Sepsis Labs Recent Labs  Lab 07/26/21 0603 07/27/21 0528 07/28/21 0551 07/29/21 0646  WBC 7.1 8.9 7.8 8.4   Microbiology Recent Results (from the past 240 hour(s))  Blood culture (routine x 2)     Status: None   Collection Time: 07/24/21  2:45 AM   Specimen: BLOOD  Result Value Ref Range Status   Specimen  Description   Final    BLOOD RIGHT ANTECUBITAL Performed at [facility], [address] [place], [place]    Special Requests   Final    BOTTLES DRAWN AEROBIC AND ANAEROBIC Blood Culture adequate volume Performed at [facility], [address] [place], [place]    Culture   Final    NO GROWTH 5 DAYS Performed at [facility], [address] [place], [place]    Report Status 07/29/2021 FINAL  Final  Blood culture (routine x 2)     Status: None   Collection Time: 07/24/21  2:49 AM   Specimen: BLOOD  Result Value Ref Range Status   Specimen Description   Final    BLOOD LEFT ANTECUBITAL Performed at [facility], [address] [place], [place]    Special Requests   Final    BOTTLES DRAWN AEROBIC AND ANAEROBIC Blood Culture adequate volume Performed at [facility], [address] [place] [address] [place], [place]  Culture   Final    NO GROWTH 5 DAYS Performed at [facility], [place] [address], [place], [place] [address]    Report Status 07/29/2021 FINAL  Final  Respiratory (~20 pathogens) panel by PCR     Status: None   Collection Time: 07/25/21  1:52 PM   Specimen: Nasopharyngeal Swab; Respiratory  Result Value Ref Range Status   Adenovirus NOT DETECTED NOT DETECTED Final   Coronavirus 229E NOT DETECTED NOT DETECTED Final    Comment: (NOTE) The Coronavirus on the Respiratory Panel, DOES NOT test for the novel  Coronavirus (2019 nCoV)    Coronavirus HKU1 NOT DETECTED NOT DETECTED Final   Coronavirus NL63 NOT DETECTED NOT DETECTED Final   Coronavirus OC43 NOT DETECTED NOT DETECTED Final   Metapneumovirus NOT DETECTED NOT DETECTED Final   Rhinovirus / Enterovirus NOT DETECTED NOT DETECTED Final   Influenza A NOT DETECTED NOT DETECTED Final   Influenza B NOT DETECTED NOT DETECTED Final   Parainfluenza Virus 1 NOT DETECTED NOT DETECTED Final   Parainfluenza Virus 2 NOT DETECTED NOT DETECTED  Final   Parainfluenza Virus 3 NOT DETECTED NOT DETECTED Final   Parainfluenza Virus 4 NOT DETECTED NOT DETECTED Final   Respiratory Syncytial Virus NOT DETECTED NOT DETECTED Final   Bordetella pertussis NOT DETECTED NOT DETECTED Final   Bordetella Parapertussis NOT DETECTED NOT DETECTED Final   Chlamydophila pneumoniae NOT DETECTED NOT DETECTED Final   Mycoplasma pneumoniae NOT DETECTED NOT DETECTED Final    Comment: Performed at [facility], [place] [address], [place], [place] [address]  SARS Coronavirus 2 by RT PCR (hospital order, performed in [facility] hospital lab) *cepheid single result test* Anterior Nasal Swab     Status: None   Collection Time: 07/25/21  1:55 PM   Specimen: Anterior Nasal Swab  Result Value Ref Range Status   SARS Coronavirus 2 by RT PCR NEGATIVE NEGATIVE Final    Comment: (NOTE) SARS-CoV-2 target nucleic acids are NOT DETECTED.  The SARS-CoV-2 RNA is generally detectable in upper and lower respiratory specimens during the acute phase of infection. The lowest concentration of SARS-CoV-2 viral copies this assay can detect is 250 copies / mL. A negative result does not preclude SARS-CoV-2 infection and should not be used as the sole basis for treatment or other patient management decisions.  A negative result may occur with improper specimen collection / handling, submission of specimen other than nasopharyngeal swab, presence of viral mutation(s) within the areas targeted by this assay, and inadequate number of viral copies (<250 copies / mL). A negative result must be combined with clinical observations, patient history, and epidemiological information.  Fact Sheet for Patients:   https://www.patel.info/  Fact Sheet for Healthcare Providers: https://hall.com/  This test is not yet approved or  cleared by the [place] FDA and has been authorized for detection and/or diagnosis of SARS-CoV-2 by FDA under  an Emergency Use Authorization (EUA).  This EUA will remain in effect (meaning this test can be used) for the duration of the COVID-19 declaration under Section 564(b)(1) of the Act, 21 U.S.C. section 360bbb-3(b)(1), unless the authorization is terminated or revoked sooner.  Performed at [facility], [place] [address] [place],  [address]   Expectorated Sputum Assessment w Gram Stain, Rflx to Resp Cult     Status: None   Collection Time: 07/27/21 11:35 PM   Specimen: Expectorated Sputum  Result Value Ref Range Status   Specimen Description EXPECTORATED SPUTUM  Final   Special Requests NONE  Final   Sputum evaluation   Final    THIS SPECIMEN IS ACCEPTABLE FOR SPUTUM CULTURE Performed at [facility], [address], [place], [place] [address]    Report Status 07/28/2021 FINAL  Final  Culture, Respiratory w Gram Stain     Status: None (Preliminary result)   Collection Time: 07/27/21 11:35 PM  Result Value Ref Range Status   Specimen Description   Final    EXPECTORATED SPUTUM Performed at [facility], [address] [place], [place] [address]    Special Requests   Final    NONE Reflexed from [id number] Performed at [facility], [address] [place], [place] [address]    Gram Stain   Final    MODERATE WBC PRESENT, PREDOMINANTLY PMN NO ORGANISMS SEEN Performed at [facility], [address] [place], [place] [address]    Culture PENDING  Incomplete   Report Status PENDING  Incomplete     Time coordinating discharge: 35 minutes  SIGNED:   Glade Lloyd, MD  [facility] 07/29/2021, 9:45 AM

## 2021-07-29 NOTE — Plan of Care (Signed)

## 2021-07-30 LAB — CULTURE, RESPIRATORY W GRAM STAIN: Culture: NORMAL

## 2022-01-20 ENCOUNTER — Emergency Department (HOSPITAL_BASED_OUTPATIENT_CLINIC_OR_DEPARTMENT_OTHER)
Admission: EM | Admit: 2022-01-20 | Discharge: 2022-01-21 | Disposition: A | Discharge status: Home / Self Care | Payer: Medicaid Other | Attending: Emergency Medicine | Admitting: Emergency Medicine

## 2022-01-20 ENCOUNTER — Emergency Department (HOSPITAL_BASED_OUTPATIENT_CLINIC_OR_DEPARTMENT_OTHER): Payer: Medicaid Other

## 2022-01-20 ENCOUNTER — Encounter (HOSPITAL_BASED_OUTPATIENT_CLINIC_OR_DEPARTMENT_OTHER): Payer: Self-pay | Admitting: Emergency Medicine

## 2022-01-20 DIAGNOSIS — K859 Acute pancreatitis without necrosis or infection, unspecified: Secondary | ICD-10-CM

## 2022-01-20 DIAGNOSIS — Z1152 Encounter for screening for COVID-19: Secondary | ICD-10-CM | POA: Diagnosis not present

## 2022-01-20 DIAGNOSIS — I10 Essential (primary) hypertension: Secondary | ICD-10-CM | POA: Insufficient documentation

## 2022-01-20 DIAGNOSIS — Z8616 Personal history of COVID-19: Secondary | ICD-10-CM | POA: Diagnosis not present

## 2022-01-20 DIAGNOSIS — Z79899 Other long term (current) drug therapy: Secondary | ICD-10-CM | POA: Diagnosis not present

## 2022-01-20 DIAGNOSIS — R109 Unspecified abdominal pain: Secondary | ICD-10-CM | POA: Diagnosis present

## 2022-01-20 DIAGNOSIS — J449 Chronic obstructive pulmonary disease, unspecified: Secondary | ICD-10-CM | POA: Diagnosis not present

## 2022-01-20 LAB — RESP PANEL BY RT-PCR (RSV, FLU A&B, COVID)  RVPGX2
Influenza A by PCR: NEGATIVE
Influenza B by PCR: NEGATIVE
Resp Syncytial Virus by PCR: NEGATIVE
SARS Coronavirus 2 by RT PCR: NEGATIVE

## 2022-01-20 LAB — CBC
HCT: 37 % (ref 36.0–46.0)
Hemoglobin: 12.5 g/dL (ref 12.0–15.0)
MCH: 34.8 pg — ABNORMAL HIGH (ref 26.0–34.0)
MCHC: 33.8 g/dL (ref 30.0–36.0)
MCV: 103.1 fL — ABNORMAL HIGH (ref 80.0–100.0)
Platelets: 259 10*3/uL (ref 150–400)
RBC: 3.59 MIL/uL — ABNORMAL LOW (ref 3.87–5.11)
RDW: 13.4 % (ref 11.5–15.5)
WBC: 7.5 10*3/uL (ref 4.0–10.5)
nRBC: 0 % (ref 0.0–0.2)

## 2022-01-20 LAB — COMPREHENSIVE METABOLIC PANEL
ALT: 21 U/L (ref 0–44)
AST: 30 U/L (ref 15–41)
Albumin: 4 g/dL (ref 3.5–5.0)
Alkaline Phosphatase: 73 U/L (ref 38–126)
Anion gap: 12 (ref 5–15)
BUN: 16 mg/dL (ref 6–20)
CO2: 28 mmol/L (ref 22–32)
Calcium: 9.8 mg/dL (ref 8.9–10.3)
Chloride: 95 mmol/L — ABNORMAL LOW (ref 98–111)
Creatinine, Ser: 0.84 mg/dL (ref 0.44–1.00)
GFR, Estimated: >60 mL/min (ref >60)
Glucose, Bld: 111 mg/dL — ABNORMAL HIGH (ref 70–99)
Potassium: 3.3 mmol/L — ABNORMAL LOW (ref 3.5–5.1)
Sodium: 135 mmol/L (ref 135–145)
Total Bilirubin: 1 mg/dL (ref 0.3–1.2)
Total Protein: 7.9 g/dL (ref 6.5–8.1)

## 2022-01-20 LAB — URINALYSIS, ROUTINE W REFLEX MICROSCOPIC
Bilirubin Urine: NEGATIVE
Glucose, UA: NEGATIVE mg/dL
Hgb urine dipstick: NEGATIVE
Ketones, ur: NEGATIVE mg/dL
Nitrite: NEGATIVE
Protein, ur: 30 mg/dL — AB
Specific Gravity, Urine: 1.015 (ref 1.005–1.030)
pH: 6.5 (ref 5.0–8.0)

## 2022-01-20 LAB — URINALYSIS, MICROSCOPIC (REFLEX)

## 2022-01-20 LAB — LIPASE, BLOOD: Lipase: 157 U/L — ABNORMAL HIGH (ref 11–51)

## 2022-01-20 MED ORDER — HYDROMORPHONE HCL 1 MG/ML IJ SOLN
0.5000 mg | Freq: Once | INTRAMUSCULAR | Status: AC
  Administered 2022-01-20: 0.5 mg via INTRAVENOUS
  Filled 2022-01-20: qty 1

## 2022-01-20 MED ORDER — IOHEXOL 300 MG/ML  SOLN
75.0000 mL | Freq: Once | INTRAMUSCULAR | Status: AC | PRN
  Administered 2022-01-20: 75 mL via INTRAVENOUS

## 2022-01-20 MED ORDER — PROMETHAZINE HCL 25 MG/ML IJ SOLN
INTRAMUSCULAR | Status: AC
  Filled 2022-01-20: qty 1

## 2022-01-20 MED ORDER — SODIUM CHLORIDE 0.9 % IV SOLN
12.5000 mg | Freq: Once | INTRAVENOUS | Status: AC
  Administered 2022-01-20: 12.5 mg via INTRAVENOUS
  Filled 2022-01-20: qty 0.5

## 2022-01-20 MED ORDER — SODIUM CHLORIDE 0.9 % IV BOLUS
1000.0000 mL | Freq: Once | INTRAVENOUS | Status: AC
  Administered 2022-01-20: 1000 mL via INTRAVENOUS

## 2022-01-20 MED ORDER — ONDANSETRON HCL 4 MG/2ML IJ SOLN
4.0000 mg | Freq: Once | INTRAMUSCULAR | Status: AC
  Administered 2022-01-20: 4 mg via INTRAVENOUS
  Filled 2022-01-20: qty 2

## 2022-01-20 NOTE — ED Notes (Signed)
Pt placed on 2L O2 via [place] due to O2 sats noted to be 84% on RA after narcotic administration.  EDP Lorin made aware.

## 2022-01-20 NOTE — ED Provider Notes (Signed)
[facility] EMERGENCY DEPARTMENT Provider Note   CSN: [id number] Arrival date & time: 01/20/22  1804     History {Add pertinent medical, surgical, social history, OB history to HPI:1} Chief Complaint  Patient presents with   Emesis    Dawn Moses is a 62 y.o. female.   Emesis      Home Medications Prior to Admission medications   Medication Sig Start Date End Date Taking? Authorizing Provider  ADVAIR DISKUS 500-50 MCG/ACT AEPB Inhale 1 puff into the lungs in the morning and at bedtime. 03/24/21   [provider]  albuterol (PROVENTIL HFA;VENTOLIN HFA) 108 (90 BASE) MCG/ACT inhaler Inhale into the lungs every 6 (six) hours as needed for wheezing or shortness of breath.    [provider]  albuterol (PROVENTIL) (2.5 MG/3ML) 0.083% nebulizer solution Take 3 mLs (2.5 mg total) by nebulization every 6 (six) hours as needed for wheezing or shortness of breath. 11/27/20   Isla Pence, MD  ASHWAGANDHA PO Take 1 tablet by mouth daily.    [provider]  atorvastatin (LIPITOR) 20 MG tablet Take 20 mg by mouth every evening. 01/26/21   [provider]  butalbital-acetaminophen-caffeine (FIORICET) 50-325-40 MG tablet Take 1 tablet by mouth every 6 (six) hours as needed for headache or migraine. 04/19/21   Oswald Hillock, MD  cetirizine (ZYRTEC) 10 MG tablet Take 10 mg by mouth daily. 03/05/21   [provider]  CREON 24000-76000 units CPEP Take 1 capsule by mouth 3 (three) times daily. 03/19/21   [provider]  Cyanocobalamin (VITAMIN B12 PO) Take 1 tablet by mouth daily.    [provider]  DULoxetine (CYMBALTA) 60 MG capsule Take 60 mg by mouth at bedtime. 03/11/21   [provider]  ELDERBERRY PO Take 2 tablets by mouth daily.    [provider]  famotidine (PEPCID) 20 MG tablet Take 20 mg by mouth 2 (two) times daily. 12/03/20   [provider]  fluticasone (FLONASE) 50 MCG/ACT nasal spray  Place 1 spray into both nostrils daily as needed for allergies.    [provider]  gabapentin (NEURONTIN) 300 MG capsule Take 300 mg by mouth 2 (two) times daily. 03/11/21   [provider]  guaiFENesin (MUCINEX) 600 MG 12 hr tablet Take 2 tablets (1,200 mg total) by mouth 2 (two) times daily. 07/29/21   Aline August, MD  guaiFENesin-dextromethorphan (ROBITUSSIN DM) 100-10 MG/5ML syrup Take 10 mLs by mouth every 4 (four) hours as needed for cough. 07/29/21   Aline August, MD  hydrochlorothiazide (HYDRODIURIL) 12.5 MG tablet Take 1 tablet (12.5 mg total) by mouth daily. 04/20/21   Oswald Hillock, MD  HYDROcodone-acetaminophen (NORCO/VICODIN) 5-325 MG tablet Take 1 tablet by mouth every 8 (eight) hours as needed for moderate pain or severe pain. 03/25/21   [provider]  hydroxychloroquine (PLAQUENIL) 200 MG tablet Take 200 mg by mouth daily. 12/03/20   [provider]  levofloxacin (LEVAQUIN) 500 MG tablet Take 1 tablet (500 mg total) by mouth daily. 07/30/21   Aline August, MD  metoprolol tartrate (LOPRESSOR) 25 MG tablet Take 1 tablet (25 mg total) by mouth 2 (two) times daily. 04/19/21   Oswald Hillock, MD  montelukast (SINGULAIR) 10 MG tablet Take 10 mg by mouth at bedtime. 12/09/20   [provider]  Multiple Vitamin (MULTIVITAMIN WITH MINERALS) TABS tablet Take 1 tablet by mouth daily.    [provider]  nicotine (NICODERM CQ - DOSED IN MG/24  HR) 7 mg/24hr patch Place 7 mg onto the skin daily. 03/09/21   [provider]  ondansetron (ZOFRAN ODT) 8 MG disintegrating tablet 8mg  ODT q8 hours prn nausea Patient taking differently: Take 8 mg by mouth every 8 (eight) hours as needed for nausea or vomiting. 10/13/13   Palumbo, April, MD  polyethylene glycol (MIRALAX / GLYCOLAX) 17 g packet Take 17 g by mouth daily as needed. Patient taking differently: Take 17 g by mouth daily as needed for moderate constipation. 04/19/21   Oswald Hillock, MD   senna-docusate (SENOKOT-S) 8.6-50 MG tablet Take 1 tablet by mouth at bedtime as needed for mild constipation. 04/19/21   Oswald Hillock, MD  SPIRIVA HANDIHALER 18 MCG inhalation capsule Place 1 capsule into inhaler and inhale daily. 02/17/21   [provider]  tiZANidine (ZANAFLEX) 4 MG tablet Take 4 mg by mouth at bedtime. 12/03/20   [provider]  traMADol (ULTRAM) 50 MG tablet Take 50 mg by mouth every 8 (eight) hours as needed for moderate pain. 03/25/21   [provider]  trimethoprim-polymyxin b (POLYTRIM) ophthalmic solution Place 1 drop into the left eye daily as needed (conjunctivitis).    [provider]  vitamin C (ASCORBIC ACID) 500 MG tablet Take 500 mg by mouth daily.    [provider]      Allergies    Penicillins, Azithromycin, Cephalexin, Cyclobenzaprine, and Lisinopril    Review of Systems   Review of Systems  Gastrointestinal:  Positive for vomiting.    Physical Exam Updated Vital Signs BP (!) 160/100   Pulse 77   Temp 97.9 F (36.6 C)   Resp (!) 21   Ht 5\' 6"  (1.676 m)   Wt 59 kg   SpO2 94%   BMI 20.98 kg/m  Physical Exam  ED Results / Procedures / Treatments   Labs (all labs ordered are listed, but only abnormal results are displayed) Labs Reviewed  LIPASE, BLOOD - Abnormal; Notable for the following components:      Result Value   Lipase 157 (*)    All other components within normal limits  COMPREHENSIVE METABOLIC PANEL - Abnormal; Notable for the following components:   Potassium 3.3 (*)    Chloride 95 (*)    Glucose, Bld 111 (*)    All other components within normal limits  CBC - Abnormal; Notable for the following components:   RBC 3.59 (*)    MCV 103.1 (*)    MCH 34.8 (*)    All other components within normal limits  RESP PANEL BY RT-PCR (RSV, FLU A&B, COVID)  [id number]  URINALYSIS, ROUTINE W REFLEX MICROSCOPIC    EKG None  Radiology No results found.  Procedures Procedures  {Document  cardiac monitor, telemetry assessment procedure when appropriate:1}  Medications Ordered in ED Medications  promethazine (PHENERGAN) 12.5 mg in sodium chloride 0.9 % 50 mL IVPB (has no administration in time range)  HYDROmorphone (DILAUDID) injection 0.5 mg (has no administration in time range)  sodium chloride 0.9 % bolus 1,000 mL (1,000 mLs Intravenous New Bag/Given 01/20/22 2150)  ondansetron (ZOFRAN) injection 4 mg (4 mg Intravenous Given 01/20/22 2150)  iohexol (OMNIPAQUE) 300 MG/ML solution 75 mL (75 mLs Intravenous Contrast Given 01/20/22 2159)    ED Course/ Medical Decision Making/ A&P                           Medical Decision Making Amount and/or Complexity of Data  Reviewed Labs: ordered. Radiology: ordered.  Risk Prescription drug management.  This patient is a 62 y.o. female  who presents to the ED for concern of upper abdominal pain and vomiting starting this morning.   Differential diagnoses prior to evaluation: The emergent differential diagnosis includes, but is not limited to,  PUD, GERD, gastritis, pancreatitis, indigestion, drug associated, gastroparesis, malignancy, biliary disease, ACS, pericarditis, pneumonia, abdominal hernia, intestinal ischemia, esophageal rupture, volvulus, hepatitis. This is not an exhaustive differential.   Past Medical History / Co-morbidities: COPD, fibromyalgia, pulmonary fibrosis, hypertension, chronic pancreatitis, sarcoidosis  Additional history: Chart reviewed. Pertinent results include: Had laparoscopic cholecystectomy in April 2022 after gallbladder sludge and acute pancreatitis. Performed at [facility] by Dr Carlis Abbott.  Physical Exam: Physical exam performed. The pertinent findings include: Hypertensive, otherwise normal vital signs. Afebrile. Epigastric tenderness to palpation without guarding.   Lab Tests/Imaging studies: I personally interpreted labs/imaging and the pertinent results include: No leukocytosis, hemoglobin  stable.  Potassium 3.3, otherwise CMP unremarkable.  Normal kidney and liver function.  Lipase 157.  Respiratory panel negative for COVID and flu.  CT abdomen pelvis with 10 mm cystic structure in the pancreas, no other acute findings. I agree with the radiologist interpretation.  Cardiac monitoring: EKG obtained and interpreted by my attending physician which shows: Sinus rhythm   Medications: I ordered medication including IV fluids, antiemetics, analgesics.  I have reviewed the patients home medicines and have made adjustments as needed.   Disposition: After consideration of the diagnostic results and the patients response to treatment, I feel that *** .   ***emergency department workup does not suggest an emergent condition requiring admission or immediate intervention beyond what has been performed at this time. The plan is: ***. The patient is safe for discharge and has been instructed to return immediately for worsening symptoms, change in symptoms or any other concerns.  Final Clinical Impression(s) / ED Diagnoses Final diagnoses:  None    Rx / DC Orders ED Discharge Orders     None      Portions of this report may have been transcribed using voice recognition software. Every effort was made to ensure accuracy; however, inadvertent computerized transcription errors may be present.

## 2022-01-20 NOTE — Discharge Instructions (Addendum)
You were seen in the emergency department for abdominal pain, vomiting, and dizziness.  As we discussed, I think your symptoms are related to pancreatitis. Your pancreas levels were very elevated today. We have given you pain medication and nausea medication, and I'm glad it helped you.  I've sent prescriptions for both to your pharmacy. Try to keep up your fluid intake. When you feel up to it, start with a bland diet and work your way up to normal foods. Avoid fatty or greasy foods.  I want you to follow up with the GI doctor as soon as you're able.  Continue to monitor how you're doing and return to the ER for new or worsening symptoms.

## 2022-01-20 NOTE — ED Provider Notes (Incomplete)
[facility] EMERGENCY DEPARTMENT Provider Note   CSN: [id number] Arrival date & time: 01/20/22  1804     History {Add pertinent medical, surgical, social history, OB history to HPI:1} Chief Complaint  Patient presents with  . Emesis    Dawn Moses is a 62 y.o. female with history of COPD, fibromyalgia, pulmonary fibrosis, hypertension, chronic pancreatitis, sarcoidosis who presents the emergency department complaining of abdominal pain, vomiting, dizziness starting earlier this morning.  Patient states that she was getting up to use the bathroom and felt very unsteady on her feet.  She then began vomiting uncontrollably.  She believes she has vomited about 9 times today, nonbloody and nonbilious. Complaining of pain in her upper abdomen, worse to the touch and with vomiting.  States that she has had similar symptoms in the past, once before due to pneumonia, prior to that due to gallstone pancreatitis.  She had her gallbladder removed at that time.  No diarrhea or urinary symptoms.  No fever or chills.   Emesis Associated symptoms: abdominal pain   Associated symptoms: no chills, no diarrhea and no fever        Home Medications Prior to Admission medications   Medication Sig Start Date End Date Taking? Authorizing Provider  ADVAIR DISKUS 500-50 MCG/ACT AEPB Inhale 1 puff into the lungs in the morning and at bedtime. 03/24/21   [provider]  albuterol (PROVENTIL HFA;VENTOLIN HFA) 108 (90 BASE) MCG/ACT inhaler Inhale into the lungs every 6 (six) hours as needed for wheezing or shortness of breath.    [provider]  albuterol (PROVENTIL) (2.5 MG/3ML) 0.083% nebulizer solution Take 3 mLs (2.5 mg total) by nebulization every 6 (six) hours as needed for wheezing or shortness of breath. 11/27/20   Isla Pence, MD  ASHWAGANDHA PO Take 1 tablet by mouth daily.    [provider]  atorvastatin (LIPITOR) 20 MG tablet Take 20 mg by mouth every  evening. 01/26/21   [provider]  butalbital-acetaminophen-caffeine (FIORICET) 50-325-40 MG tablet Take 1 tablet by mouth every 6 (six) hours as needed for headache or migraine. 04/19/21   Oswald Hillock, MD  cetirizine (ZYRTEC) 10 MG tablet Take 10 mg by mouth daily. 03/05/21   [provider]  CREON 24000-76000 units CPEP Take 1 capsule by mouth 3 (three) times daily. 03/19/21   [provider]  Cyanocobalamin (VITAMIN B12 PO) Take 1 tablet by mouth daily.    [provider]  DULoxetine (CYMBALTA) 60 MG capsule Take 60 mg by mouth at bedtime. 03/11/21   [provider]  ELDERBERRY PO Take 2 tablets by mouth daily.    [provider]  famotidine (PEPCID) 20 MG tablet Take 20 mg by mouth 2 (two) times daily. 12/03/20   [provider]  fluticasone (FLONASE) 50 MCG/ACT nasal spray Place 1 spray into both nostrils daily as needed for allergies.    [provider]  gabapentin (NEURONTIN) 300 MG capsule Take 300 mg by mouth 2 (two) times daily. 03/11/21   [provider]  guaiFENesin (MUCINEX) 600 MG 12 hr tablet Take 2 tablets (1,200 mg total) by mouth 2 (two) times daily. 07/29/21   Aline August, MD  guaiFENesin-dextromethorphan (ROBITUSSIN DM) 100-10 MG/5ML syrup Take 10 mLs by mouth every 4 (four) hours as needed for cough. 07/29/21   Aline August, MD  hydrochlorothiazide (HYDRODIURIL) 12.5 MG tablet Take 1 tablet (12.5 mg total) by mouth daily. 04/20/21   Oswald Hillock, MD  HYDROcodone-acetaminophen (NORCO/VICODIN)  5-325 MG tablet Take 1 tablet by mouth every 8 (eight) hours as needed for moderate pain or severe pain. 03/25/21   [provider]  hydroxychloroquine (PLAQUENIL) 200 MG tablet Take 200 mg by mouth daily. 12/03/20   [provider]  levofloxacin (LEVAQUIN) 500 MG tablet Take 1 tablet (500 mg total) by mouth daily. 07/30/21   Aline August, MD  metoprolol tartrate (LOPRESSOR) 25 MG tablet Take 1  tablet (25 mg total) by mouth 2 (two) times daily. 04/19/21   Oswald Hillock, MD  montelukast (SINGULAIR) 10 MG tablet Take 10 mg by mouth at bedtime. 12/09/20   [provider]  Multiple Vitamin (MULTIVITAMIN WITH MINERALS) TABS tablet Take 1 tablet by mouth daily.    [provider]  nicotine (NICODERM CQ - DOSED IN MG/24 HR) 7 mg/24hr patch Place 7 mg onto the skin daily. 03/09/21   [provider]  ondansetron (ZOFRAN ODT) 8 MG disintegrating tablet 8mg  ODT q8 hours prn nausea Patient taking differently: Take 8 mg by mouth every 8 (eight) hours as needed for nausea or vomiting. 10/13/13   Palumbo, April, MD  polyethylene glycol (MIRALAX / GLYCOLAX) 17 g packet Take 17 g by mouth daily as needed. Patient taking differently: Take 17 g by mouth daily as needed for moderate constipation. 04/19/21   Oswald Hillock, MD  senna-docusate (SENOKOT-S) 8.6-50 MG tablet Take 1 tablet by mouth at bedtime as needed for mild constipation. 04/19/21   Oswald Hillock, MD  SPIRIVA HANDIHALER 18 MCG inhalation capsule Place 1 capsule into inhaler and inhale daily. 02/17/21   [provider]  tiZANidine (ZANAFLEX) 4 MG tablet Take 4 mg by mouth at bedtime. 12/03/20   [provider]  traMADol (ULTRAM) 50 MG tablet Take 50 mg by mouth every 8 (eight) hours as needed for moderate pain. 03/25/21   [provider]  trimethoprim-polymyxin b (POLYTRIM) ophthalmic solution Place 1 drop into the left eye daily as needed (conjunctivitis).    [provider]  vitamin C (ASCORBIC ACID) 500 MG tablet Take 500 mg by mouth daily.    [provider]      Allergies    Penicillins, Azithromycin, Cephalexin, Cyclobenzaprine, and Lisinopril    Review of Systems   Review of Systems  Constitutional:  Negative for chills and fever.  Gastrointestinal:  Positive for abdominal pain, nausea and vomiting. Negative for diarrhea.  All other systems reviewed and are  negative.   Physical Exam Updated Vital Signs BP (!) 160/100   Pulse 77   Temp 97.9 F (36.6 C)   Resp (!) 21   Ht 5\' 6"  (1.676 m)   Wt 59 kg   SpO2 94%   BMI 20.98 kg/m  Physical Exam Vitals and nursing note reviewed.  Constitutional:      Appearance: Normal appearance.  HENT:     Head: Normocephalic and atraumatic.  Eyes:     Conjunctiva/sclera: Conjunctivae normal.  Cardiovascular:     Rate and Rhythm: Normal rate and regular rhythm.  Pulmonary:     Effort: Pulmonary effort is normal. No respiratory distress.     Breath sounds: Normal breath sounds.  Abdominal:     General: There is no distension.     Palpations: Abdomen is soft.     Tenderness: There is abdominal tenderness in the epigastric area.  Skin:    General: Skin is warm and dry.  Neurological:     General: No focal deficit present.  Mental Status: She is alert.     ED Results / Procedures / Treatments   Labs (all labs ordered are listed, but only abnormal results are displayed) Labs Reviewed  LIPASE, BLOOD - Abnormal; Notable for the following components:      Result Value   Lipase 157 (*)    All other components within normal limits  COMPREHENSIVE METABOLIC PANEL - Abnormal; Notable for the following components:   Potassium 3.3 (*)    Chloride 95 (*)    Glucose, Bld 111 (*)    All other components within normal limits  CBC - Abnormal; Notable for the following components:   RBC 3.59 (*)    MCV 103.1 (*)    MCH 34.8 (*)    All other components within normal limits  RESP PANEL BY RT-PCR (RSV, FLU A&B, COVID)  RVPGX2  URINALYSIS, ROUTINE W REFLEX MICROSCOPIC    EKG None  Radiology No results found.  Procedures Procedures  {Document cardiac monitor, telemetry assessment procedure when appropriate:1}  Medications Ordered in ED Medications  promethazine (PHENERGAN) 12.5 mg in sodium chloride 0.9 % 50 mL IVPB (has no administration in time range)  HYDROmorphone (DILAUDID) injection  0.5 mg (has no administration in time range)  sodium chloride 0.9 % bolus 1,000 mL (1,000 mLs Intravenous New Bag/Given 01/20/22 2150)  ondansetron (ZOFRAN) injection 4 mg (4 mg Intravenous Given 01/20/22 2150)  iohexol (OMNIPAQUE) 300 MG/ML solution 75 mL (75 mLs Intravenous Contrast Given 01/20/22 2159)    ED Course/ Medical Decision Making/ A&P                           Medical Decision Making Amount and/or Complexity of Data Reviewed Labs: ordered. Radiology: ordered.  Risk Prescription drug management.  This patient is a 62 y.o. female  who presents to the ED for concern of upper abdominal pain and vomiting starting this morning.   Differential diagnoses prior to evaluation: The emergent differential diagnosis includes, but is not limited to,  PUD, GERD, gastritis, pancreatitis, indigestion, drug associated, gastroparesis, malignancy, biliary disease, ACS, pericarditis, pneumonia, abdominal hernia, intestinal ischemia, esophageal rupture, volvulus, hepatitis. This is not an exhaustive differential.   Past Medical History / Co-morbidities: COPD, fibromyalgia, pulmonary fibrosis, hypertension, chronic pancreatitis, sarcoidosis  Additional history: Chart reviewed. Pertinent results include: Had laparoscopic cholecystectomy in April 2022 after gallbladder sludge and acute pancreatitis. Performed at [facility] by Dr Carlis Abbott.  Physical Exam: Physical exam performed. The pertinent findings include: Hypertensive, otherwise normal vital signs. Afebrile. Epigastric tenderness to palpation without guarding.   Lab Tests/Imaging studies: I personally interpreted labs/imaging and the pertinent results include: No leukocytosis, hemoglobin stable.  Potassium 3.3, otherwise CMP unremarkable.  Normal kidney and liver function.  Lipase 157.  Respiratory panel negative for COVID and flu.  Urinalysis negative for hematuria or infection.  CT abdomen pelvis with 10 mm cystic structure in the  pancreas, no other acute findings. I agree with the radiologist interpretation.  Cardiac monitoring: EKG obtained and interpreted by my attending physician which shows: Sinus rhythm   Medications: I ordered medication including IV fluids, antiemetics, analgesics.  I have reviewed the patients home medicines and have made adjustments as needed.  Upon reevaluation patient states that her symptoms have somewhat improved, passed p.o. challenge.   Disposition: After consideration of the diagnostic results and the patients response to treatment, I feel that emergency department workup does not suggest an emergent condition requiring admission or immediate intervention  beyond what has been performed at this time. The plan is: Charged home with symptomatic management of mild pancreatitis flare. The patient is safe for discharge and has been instructed to return immediately for worsening symptoms, change in symptoms or any other concerns.  I discussed this case with my attending physician Dr. Tyrone Nine who cosigned this note including patient's presenting symptoms, physical exam, and planned diagnostics and interventions. Attending physician stated agreement with plan or made changes to plan which were implemented.   Final Clinical Impression(s) / ED Diagnoses Final diagnoses:  None    Rx / DC Orders ED Discharge Orders     None      Portions of this report may have been transcribed using voice recognition software. Every effort was made to ensure accuracy; however, inadvertent computerized transcription errors may be present.

## 2022-01-20 NOTE — ED Triage Notes (Signed)
Pt w/ vomiting and dizziness since 0900; unable to keep anything down; c/o generalized abd pain

## 2022-01-21 MED ORDER — PROMETHAZINE HCL 25 MG PO TABS: 25.0000 mg | ORAL_TABLET | Freq: Four times a day (QID) | ORAL | 0 refills | Status: AC | PRN

## 2022-01-21 MED ORDER — HYDROCODONE-ACETAMINOPHEN 5-325 MG PO TABS: 1.0000 | ORAL_TABLET | ORAL | 0 refills | Status: AC | PRN

## 2022-01-21 NOTE — ED Notes (Signed)
D/c paperwork reviewed with pt, including prescriptions and follow up care.  All questions and/or concerns addressed at time of d/c.  No further needs expressed. . Pt verbalized understanding, Wheeled by ED staff to ED exit, NAD.   

## 2023-10-19 ENCOUNTER — Emergency Department (HOSPITAL_BASED_OUTPATIENT_CLINIC_OR_DEPARTMENT_OTHER)

## 2023-10-19 ENCOUNTER — Emergency Department (HOSPITAL_BASED_OUTPATIENT_CLINIC_OR_DEPARTMENT_OTHER)
Admission: EM | Admit: 2023-10-19 | Discharge: 2023-10-19 | Disposition: A | Discharge status: Home / Self Care | Attending: Emergency Medicine | Admitting: Emergency Medicine

## 2023-10-19 ENCOUNTER — Other Ambulatory Visit: Payer: Self-pay

## 2023-10-19 DIAGNOSIS — Z7951 Long term (current) use of inhaled steroids: Secondary | ICD-10-CM | POA: Diagnosis not present

## 2023-10-19 DIAGNOSIS — I10 Essential (primary) hypertension: Secondary | ICD-10-CM | POA: Insufficient documentation

## 2023-10-19 DIAGNOSIS — Z9981 Dependence on supplemental oxygen: Secondary | ICD-10-CM | POA: Diagnosis not present

## 2023-10-19 DIAGNOSIS — J449 Chronic obstructive pulmonary disease, unspecified: Secondary | ICD-10-CM | POA: Diagnosis not present

## 2023-10-19 DIAGNOSIS — Z79899 Other long term (current) drug therapy: Secondary | ICD-10-CM | POA: Insufficient documentation

## 2023-10-19 DIAGNOSIS — R600 Localized edema: Secondary | ICD-10-CM | POA: Insufficient documentation

## 2023-10-19 DIAGNOSIS — M791 Myalgia, unspecified site: Secondary | ICD-10-CM | POA: Diagnosis not present

## 2023-10-19 DIAGNOSIS — M7989 Other specified soft tissue disorders: Secondary | ICD-10-CM | POA: Diagnosis present

## 2023-10-19 DIAGNOSIS — F17290 Nicotine dependence, other tobacco product, uncomplicated: Secondary | ICD-10-CM | POA: Insufficient documentation

## 2023-10-19 DIAGNOSIS — R52 Pain, unspecified: Secondary | ICD-10-CM

## 2023-10-19 LAB — CBC WITH DIFFERENTIAL/PLATELET
Abs Immature Granulocytes: 0.16 K/uL — ABNORMAL HIGH (ref 0.00–0.07)
Basophils Absolute: 0 K/uL (ref 0.0–0.1)
Basophils Relative: 0 %
Eosinophils Absolute: 0.2 K/uL (ref 0.0–0.5)
Eosinophils Relative: 2 %
HCT: 33.9 % — ABNORMAL LOW (ref 36.0–46.0)
Hemoglobin: 11.4 g/dL — ABNORMAL LOW (ref 12.0–15.0)
Immature Granulocytes: 2 %
Lymphocytes Relative: 12 %
Lymphs Abs: 1.1 K/uL (ref 0.7–4.0)
MCH: 32.6 pg (ref 26.0–34.0)
MCHC: 33.6 g/dL (ref 30.0–36.0)
MCV: 96.9 fL (ref 80.0–100.0)
Monocytes Absolute: 1.1 K/uL — ABNORMAL HIGH (ref 0.1–1.0)
Monocytes Relative: 11 %
Neutro Abs: 7 K/uL (ref 1.7–7.7)
Neutrophils Relative %: 73 %
Platelets: 289 K/uL (ref 150–400)
RBC: 3.5 MIL/uL — ABNORMAL LOW (ref 3.87–5.11)
RDW: 14.4 % (ref 11.5–15.5)
WBC: 9.6 K/uL (ref 4.0–10.5)
nRBC: 0 % (ref 0.0–0.2)

## 2023-10-19 LAB — COMPREHENSIVE METABOLIC PANEL WITH GFR
ALT: 75 U/L — ABNORMAL HIGH (ref 0–44)
AST: 70 U/L — ABNORMAL HIGH (ref 15–41)
Albumin: 4.2 g/dL (ref 3.5–5.0)
Alkaline Phosphatase: 56 U/L (ref 38–126)
Anion gap: 15 (ref 5–15)
BUN: 8 mg/dL (ref 8–23)
CO2: 31 mmol/L (ref 22–32)
Calcium: 8.8 mg/dL — ABNORMAL LOW (ref 8.9–10.3)
Chloride: 97 mmol/L — ABNORMAL LOW (ref 98–111)
Creatinine, Ser: 1.23 mg/dL — ABNORMAL HIGH (ref 0.44–1.00)
GFR, Estimated: 50 mL/min — ABNORMAL LOW (ref >60)
Glucose, Bld: 118 mg/dL — ABNORMAL HIGH (ref 70–99)
Potassium: 3.5 mmol/L (ref 3.5–5.1)
Sodium: 142 mmol/L (ref 135–145)
Total Bilirubin: 0.5 mg/dL (ref 0.0–1.2)
Total Protein: 6.7 g/dL (ref 6.5–8.1)

## 2023-10-19 LAB — RESP PANEL BY RT-PCR (RSV, FLU A&B, COVID)  RVPGX2
Influenza A by PCR: NEGATIVE
Influenza B by PCR: NEGATIVE
Resp Syncytial Virus by PCR: NEGATIVE
SARS Coronavirus 2 by RT PCR: NEGATIVE

## 2023-10-19 MED ORDER — ONDANSETRON HCL 4 MG/2ML IJ SOLN
4.0000 mg | Freq: Once | INTRAMUSCULAR | Status: AC
  Administered 2023-10-19: 4 mg via INTRAVENOUS
  Filled 2023-10-19: qty 2

## 2023-10-19 MED ORDER — HYDROMORPHONE HCL 1 MG/ML IJ SOLN
1.0000 mg | Freq: Once | INTRAMUSCULAR | Status: AC
  Administered 2023-10-19: 1 mg via INTRAVENOUS
  Filled 2023-10-19: qty 1

## 2023-10-19 MED ORDER — SODIUM CHLORIDE 0.9 % IV BOLUS
500.0000 mL | Freq: Once | INTRAVENOUS | Status: AC
  Administered 2023-10-19: 500 mL via INTRAVENOUS

## 2023-10-19 MED ORDER — GUAIFENESIN-DM 100-10 MG/5ML PO SYRP
5.0000 mL | ORAL_SOLUTION | Freq: Once | ORAL | Status: AC
  Administered 2023-10-19: 5 mL via ORAL
  Filled 2023-10-19: qty 5

## 2023-10-19 NOTE — Telephone Encounter (Signed)
 mailed

## 2023-10-19 NOTE — ED Provider Notes (Addendum)
 [place] EMERGENCY DEPARTMENT AT [facility] Provider Note   CSN: [id number] Arrival date & time: 10/19/23  8060     Patient presents with: Generalized Body Aches, Fatigue, and Nausea   Dawn Moses is a 63 y.o. female.   Patient recently admitted to [facility] from September 19 to September 25 for COPD exacerbation.  According to their notes patient has a history of sarcoidosis COPD and anemia.  Patient tells me she also has a history of fibromyalgia.  Patient just finished up a course of steroids yesterday.  Patient is an everyday smoker.  Patient's past medical history and is listed as COPD sarcoidosis hypertension and asthma.  Patient does not use any tobacco products.  At this time patient states that starting at about 830 this morning she got body aches all over.  She thinks is due to her fibromyalgia she has some chills vomited once but no chest pain no real shortness of breath but she is on 2 L of oxygen she has oxygen available at home as needed.  She is generally feeling weak and malaise.  Oxygen saturation on the 2 L is 97%.       Prior to Admission medications   Medication Sig Start Date End Date Taking? Authorizing Provider  ADVAIR DISKUS 500-50 MCG/ACT AEPB Inhale 1 puff into the lungs in the morning and at bedtime. 03/24/21   [provider]  albuterol  (PROVENTIL  HFA;VENTOLIN  HFA) 108 (90 BASE) MCG/ACT inhaler Inhale into the lungs every 6 (six) hours as needed for wheezing or shortness of breath.    [provider]  albuterol  (PROVENTIL ) (2.5 MG/3ML) 0.083% nebulizer solution Take 3 mLs (2.5 mg total) by nebulization every 6 (six) hours as needed for wheezing or shortness of breath. 11/27/20   Haviland, Julie, MD  ASHWAGANDHA PO Take 1 tablet by mouth daily.    [provider]  atorvastatin  (LIPITOR) 20 MG tablet Take 20 mg by mouth every evening. 01/26/21   [provider]  butalbital -acetaminophen -caffeine   (FIORICET ) 50-325-40 MG tablet Take 1 tablet by mouth every 6 (six) hours as needed for headache or migraine. 04/19/21   Drusilla Sabas RAMAN, MD  cetirizine (ZYRTEC) 10 MG tablet Take 10 mg by mouth daily. 03/05/21   [provider]  CREON  24000-76000 units CPEP Take 1 capsule by mouth 3 (three) times daily. 03/19/21   [provider]  Cyanocobalamin (VITAMIN B12 PO) Take 1 tablet by mouth daily.    [provider]  DULoxetine  (CYMBALTA ) 60 MG capsule Take 60 mg by mouth at bedtime. 03/11/21   [provider]  ELDERBERRY PO Take 2 tablets by mouth daily.    [provider]  famotidine  (PEPCID ) 20 MG tablet Take 20 mg by mouth 2 (two) times daily. 12/03/20   [provider]  fluticasone  (FLONASE ) 50 MCG/ACT nasal spray Place 1 spray into both nostrils daily as needed for allergies.    [provider]  gabapentin  (NEURONTIN ) 300 MG capsule Take 300 mg by mouth 2 (two) times daily. 03/11/21   [provider]  guaiFENesin  (MUCINEX ) 600 MG 12 hr tablet Take 2 tablets (1,200 mg total) by mouth 2 (two) times daily. 07/29/21   Cheryle Page, MD  guaiFENesin -dextromethorphan  (ROBITUSSIN DM) 100-10 MG/5ML syrup Take 10 mLs by mouth every 4 (four) hours as needed for cough. 07/29/21   Cheryle Page, MD  hydrochlorothiazide  (HYDRODIURIL ) 12.5 MG tablet Take 1 tablet (12.5 mg total) by mouth daily. 04/20/21   Drusilla,  Sabas RAMAN, MD  HYDROcodone -acetaminophen  (NORCO/VICODIN) 5-325 MG tablet Take 1-2 tablets by mouth every 4 (four) hours as needed for severe pain. 01/21/22   Roemhildt, Lorin T, PA-C  hydroxychloroquine  (PLAQUENIL ) 200 MG tablet Take 200 mg by mouth daily. 12/03/20   [provider]  levofloxacin  (LEVAQUIN ) 500 MG tablet Take 1 tablet (500 mg total) by mouth daily. 07/30/21   Cheryle Page, MD  metoprolol  tartrate (LOPRESSOR ) 25 MG tablet Take 1 tablet (25 mg total) by mouth 2 (two) times daily. 04/19/21   Drusilla Sabas RAMAN, MD  montelukast   (SINGULAIR ) 10 MG tablet Take 10 mg by mouth at bedtime. 12/09/20   [provider]  Multiple Vitamin (MULTIVITAMIN WITH MINERALS) TABS tablet Take 1 tablet by mouth daily.    [provider]  nicotine  (NICODERM CQ  - DOSED IN MG/24 HR) 7 mg/24hr patch Place 7 mg onto the skin daily. 03/09/21   [provider]  ondansetron  (ZOFRAN  ODT) 8 MG disintegrating tablet 8mg  ODT q8 hours prn nausea Patient taking differently: Take 8 mg by mouth every 8 (eight) hours as needed for nausea or vomiting. 10/13/13   Palumbo, April, MD  polyethylene glycol (MIRALAX  / GLYCOLAX ) 17 g packet Take 17 g by mouth daily as needed. Patient taking differently: Take 17 g by mouth daily as needed for moderate constipation. 04/19/21   Drusilla Sabas RAMAN, MD  promethazine  (PHENERGAN ) 25 MG tablet Take 1 tablet (25 mg total) by mouth every 6 (six) hours as needed for nausea or vomiting. 01/21/22   Roemhildt, Lorin T, PA-C  senna-docusate (SENOKOT-S) 8.6-50 MG tablet Take 1 tablet by mouth at bedtime as needed for mild constipation. 04/19/21   Drusilla Sabas RAMAN, MD  SPIRIVA HANDIHALER 18 MCG inhalation capsule Place 1 capsule into inhaler and inhale daily. 02/17/21   [provider]  tiZANidine  (ZANAFLEX ) 4 MG tablet Take 4 mg by mouth at bedtime. 12/03/20   [provider]  traMADol  (ULTRAM ) 50 MG tablet Take 50 mg by mouth every 8 (eight) hours as needed for moderate pain. 03/25/21   [provider]  trimethoprim -polymyxin b  (POLYTRIM ) ophthalmic solution Place 1 drop into the left eye daily as needed (conjunctivitis).    [provider]  vitamin C (ASCORBIC ACID ) 500 MG tablet Take 500 mg by mouth daily.    [provider]    Allergies: Penicillins, Azithromycin , Cephalexin, Cyclobenzaprine , and Lisinopril    Review of Systems  Constitutional:  Positive for chills and fatigue. Negative for fever.  HENT:  Negative for ear pain and sore throat.   Eyes:  Negative for pain and  visual disturbance.  Respiratory:  Negative for cough and shortness of breath.   Cardiovascular:  Negative for chest pain and palpitations.  Gastrointestinal:  Negative for abdominal pain and vomiting.  Genitourinary:  Negative for dysuria and hematuria.  Musculoskeletal:  Positive for myalgias. Negative for arthralgias and back pain.  Skin:  Negative for color change and rash.  Neurological:  Negative for seizures and syncope.  All other systems reviewed and are negative.   Updated Vital Signs BP (!) 145/107 (BP Location: Left Arm)   Pulse (!) 106   Temp 98.3 F (36.8 C) (Oral)   Resp 20   Ht 1.676 m (5' 6)   Wt 67.1 kg   SpO2 96%   BMI 23.89 kg/m   Physical Exam Vitals and nursing note reviewed.  Constitutional:      General: She is not in acute distress.    Appearance:  Normal appearance. She is well-developed. She is not ill-appearing.  HENT:     Head: Normocephalic and atraumatic.     Mouth/Throat:     Mouth: Mucous membranes are moist.  Eyes:     Extraocular Movements: Extraocular movements intact.     Conjunctiva/sclera: Conjunctivae normal.     Pupils: Pupils are equal, round, and reactive to light.  Cardiovascular:     Rate and Rhythm: Normal rate and regular rhythm.     Heart sounds: No murmur heard. Pulmonary:     Effort: Pulmonary effort is normal. No respiratory distress.     Breath sounds: Normal breath sounds. No stridor. No wheezing, rhonchi or rales.  Abdominal:     Palpations: Abdomen is soft.     Tenderness: There is no abdominal tenderness.  Musculoskeletal:        General: No swelling.     Cervical back: Neck supple.     Right lower leg: Edema present.     Left lower leg: Edema present.     Comments: Edema just at the ankles.  Skin:    General: Skin is warm and dry.     Capillary Refill: Capillary refill takes less than 2 seconds.  Neurological:     General: No focal deficit present.     Mental Status: She is alert and oriented to person,  place, and time.  Psychiatric:        Mood and Affect: Mood normal.     (all labs ordered are listed, but only abnormal results are displayed) Labs Reviewed  CBC WITH DIFFERENTIAL/PLATELET - Abnormal; Notable for the following components:      Result Value   RBC 3.50 (*)    Hemoglobin 11.4 (*)    HCT 33.9 (*)    Monocytes Absolute 1.1 (*)    Abs Immature Granulocytes 0.16 (*)    All other components within normal limits  COMPREHENSIVE METABOLIC PANEL WITH GFR - Abnormal; Notable for the following components:   Chloride 97 (*)    Glucose, Bld 118 (*)    Creatinine, Ser 1.23 (*)    Calcium  8.8 (*)    AST 70 (*)    ALT 75 (*)    GFR, Estimated 50 (*)    All other components within normal limits  RESP PANEL BY RT-PCR (RSV, FLU A&B, COVID)  RVPGX2    EKG: None  Radiology: [facility] Chest Port 1 View Result Date: 10/19/2023 CLINICAL DATA:  History of sarcoidosis.  Myalgia and chills. EXAM: PORTABLE CHEST 1 VIEW COMPARISON:  Chest x-ray 08/23/2023, CT chest 10/09/2023 FINDINGS: The heart and mediastinal contours are within normal limits. Atherosclerotic plaque. Redemonstration of biapical architectural distortion and emphysematous changes. No focal consolidation. Chronic coarsened interstitial markings with no overt pulmonary edema. No pleural effusion. No pneumothorax. No acute osseous abnormality. IMPRESSION: 1. No active disease. 2. Aortic Atherosclerosis (ICD10-I70.0) and Emphysema (ICD10-J43.9). Electronically Signed   By: Morgane  Naveau M.D.   On: 10/19/2023 21:00     Procedures   Medications Ordered in the ED  HYDROmorphone  (DILAUDID ) injection 1 mg (has no administration in time range)  sodium chloride  0.9 % bolus 500 mL (500 mLs Intravenous New Bag/Given 10/19/23 2048)  ondansetron  (ZOFRAN ) injection 4 mg (4 mg Intravenous Given 10/19/23 2049)  HYDROmorphone  (DILAUDID ) injection 1 mg (1 mg Intravenous Given 10/19/23 2049)  Medical Decision  Making Amount and/or Complexity of Data Reviewed Labs: ordered. Radiology: ordered.  Risk Prescription drug management.   Patient's vital signs here temp 98.3 pulse 106 respirations 20 blood pressure 145/107.  Patient states that her pain may be fibromyalgia.  Respiratory panel is pending.  Will go and give fluids and pain control antinausea medicine and will get chest x-ray and labs  EKG is not crossing over.  Shows sinus tachycardia with a heart rate of 103 some right axis deviation.  But no other acute changes.  Respiratory panel negative.  CBC white count 9.6 hemoglobin 11.4 platelets 289.  Complete metabolic panel potassium normal sodium normal chloride down a little bit at 97 glucose 118 GFR 50 LFTs normal other than AST ALT up just a little bit.  Normal anion gap.  Portable chest x-ray no active disease.  Patient given pain medication will recheck.  Lungs here were clear.  No evidence of any significant COPD exacerbation and oxygen sats were 97% on the 2 L of oxygen which she does have at home.  Despite the history of the body aches and the chills not seeing any evidence or anything suggestive of sepsis at this point in time.  Patient feeling better with the pain medicine.  Will give second dose.  She is not followed by chronic pain management she has pain medicine at home.  She feels well enough to go home.  Patient does have oxygen at home.  Patient's symptoms could be secondary to fibromyalgia.  Could be due to her chronic pain.  Definitely improving with pain medicine.  Past medical history does not list fibromyalgia.  Patient nontoxic no acute distress  Final diagnoses:  Myalgia  Generalized body aches    ED Discharge Orders     None          Geraldene Hamilton, MD 10/19/23 2034    Geraldene Hamilton, MD 10/19/23 7963    Geraldene Hamilton, MD 10/19/23 2156

## 2023-10-19 NOTE — ED Notes (Signed)
 Patient is trying to call various people for a ride home.

## 2023-10-19 NOTE — Discharge Instructions (Signed)
 Workup here today was reassuring.  Chest x-ray negative labs without significant abnormalities.  Take your pain medicine that you have at home.  Make an appointment to follow-up with your doctors.  Use your oxygen as needed.

## 2023-10-19 NOTE — ED Triage Notes (Signed)
 Symptoms started today.  Emesis x 1.  General feelings of malaise. Wears 2L [place] @ home.

## 2023-10-24 IMAGING — DX DG CHEST 1V PORT
1 series · 1 of 1 positions shown · non-contrast
Comparison: Chest radiograph dated 11/27/2020 and CT dated
03/19/2021.

CLINICAL DATA: Shortness of breath.  Sarcoidosis.

EXAM:
PORTABLE CHEST 1 VIEW

[chest ap]
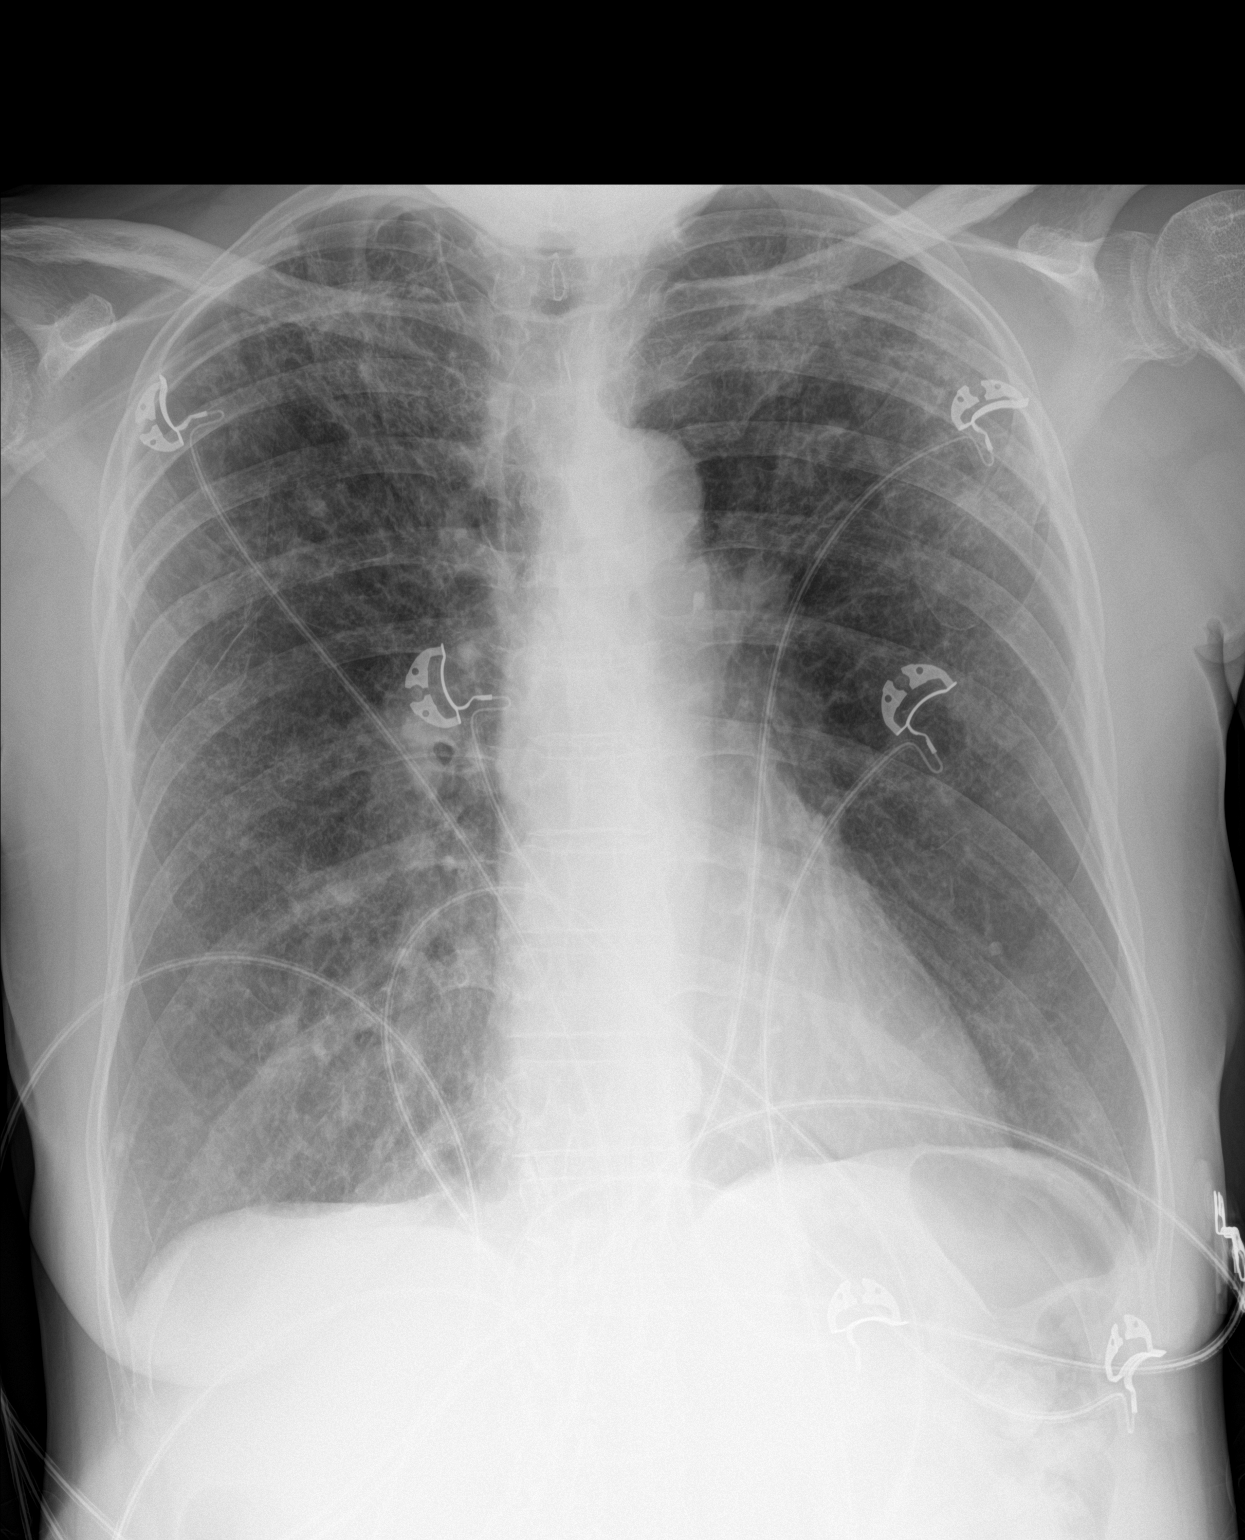

[1 of 1 positions shown; findings below may reference images not displayed]

FINDINGS: Diffuse chronic interstitial coarsening and bilateral upper lobe
reticular scarring in keeping with sarcoidosis. Scattered small
nodular densities better seen on the prior CT. No lobar
consolidation, pleural effusion, or pneumothorax. The cardiac
silhouette is within normal limits. No acute osseous pathology.
IMPRESSION: No acute cardiopulmonary process.
